# Patient Record
Sex: Male | Born: 1965 | Race: White | Hispanic: No | Marital: Married | Smoking: Never smoker
Health system: Southern US, Community
[De-identification: ages and names within clinical notes are randomized; demographics above are authoritative.]

## PROBLEM LIST (undated history)

## (undated) ENCOUNTER — Ambulatory Visit: Admission: EM | Payer: Worker's Compensation | Source: Home / Self Care

## (undated) DIAGNOSIS — E669 Obesity, unspecified: Secondary | ICD-10-CM

## (undated) DIAGNOSIS — Z9119 Patient's noncompliance with other medical treatment and regimen: Secondary | ICD-10-CM

## (undated) DIAGNOSIS — Z91199 Patient's noncompliance with other medical treatment and regimen due to unspecified reason: Secondary | ICD-10-CM

## (undated) DIAGNOSIS — I4891 Unspecified atrial fibrillation: Secondary | ICD-10-CM

## (undated) DIAGNOSIS — E119 Type 2 diabetes mellitus without complications: Secondary | ICD-10-CM

## (undated) DIAGNOSIS — Z8616 Personal history of COVID-19: Secondary | ICD-10-CM

## (undated) DIAGNOSIS — E785 Hyperlipidemia, unspecified: Secondary | ICD-10-CM

## (undated) DIAGNOSIS — I10 Essential (primary) hypertension: Secondary | ICD-10-CM

## (undated) DIAGNOSIS — K219 Gastro-esophageal reflux disease without esophagitis: Secondary | ICD-10-CM

## (undated) HISTORY — DX: Obesity, unspecified: E66.9

## (undated) HISTORY — DX: Type 2 diabetes mellitus without complications: E11.9

## (undated) HISTORY — DX: Essential (primary) hypertension: I10

## (undated) HISTORY — DX: Patient's noncompliance with other medical treatment and regimen: Z91.19

## (undated) HISTORY — DX: Hyperlipidemia, unspecified: E78.5

## (undated) HISTORY — DX: Gastro-esophageal reflux disease without esophagitis: K21.9

## (undated) HISTORY — PX: FINGER SURGERY: SHX640

## (undated) HISTORY — DX: Patient's noncompliance with other medical treatment and regimen due to unspecified reason: Z91.199

## (undated) HISTORY — DX: Personal history of COVID-19: Z86.16

## (undated) HISTORY — DX: Unspecified atrial fibrillation: I48.91

---

## 2002-04-22 HISTORY — PX: COLONOSCOPY: SHX174

## 2005-11-21 ENCOUNTER — Ambulatory Visit: Payer: Self-pay | Admitting: Family Medicine

## 2006-10-30 ENCOUNTER — Ambulatory Visit: Payer: Self-pay | Admitting: Family Medicine

## 2007-03-25 ENCOUNTER — Ambulatory Visit: Payer: Self-pay | Admitting: Family Medicine

## 2007-09-09 ENCOUNTER — Ambulatory Visit: Payer: Self-pay | Admitting: Family Medicine

## 2007-09-09 ENCOUNTER — Encounter: Admission: RE | Admit: 2007-09-09 | Discharge: 2007-09-09 | Payer: Self-pay | Admitting: Family Medicine

## 2007-09-15 ENCOUNTER — Encounter: Admission: RE | Admit: 2007-09-15 | Discharge: 2007-09-15 | Payer: Self-pay | Admitting: Family Medicine

## 2007-09-16 ENCOUNTER — Ambulatory Visit: Payer: Self-pay | Admitting: Family Medicine

## 2007-10-28 ENCOUNTER — Ambulatory Visit: Payer: Self-pay | Admitting: Family Medicine

## 2007-11-09 ENCOUNTER — Encounter: Admission: RE | Admit: 2007-11-09 | Discharge: 2007-11-09 | Payer: Self-pay | Admitting: Family Medicine

## 2007-12-10 ENCOUNTER — Ambulatory Visit: Payer: Self-pay | Admitting: Family Medicine

## 2008-01-26 ENCOUNTER — Ambulatory Visit: Payer: Self-pay | Admitting: Family Medicine

## 2008-02-03 ENCOUNTER — Ambulatory Visit: Payer: Self-pay | Admitting: Family Medicine

## 2008-02-24 ENCOUNTER — Encounter: Admission: RE | Admit: 2008-02-24 | Discharge: 2008-02-24 | Payer: Self-pay | Admitting: Family Medicine

## 2008-05-06 ENCOUNTER — Ambulatory Visit: Payer: Self-pay | Admitting: Family Medicine

## 2008-05-06 ENCOUNTER — Inpatient Hospital Stay (HOSPITAL_COMMUNITY): Admission: EM | Admit: 2008-05-06 | Discharge: 2008-05-06 | Payer: Self-pay | Admitting: Emergency Medicine

## 2008-05-11 ENCOUNTER — Ambulatory Visit: Payer: Self-pay | Admitting: Family Medicine

## 2008-06-13 ENCOUNTER — Ambulatory Visit: Payer: Self-pay | Admitting: Family Medicine

## 2008-06-22 ENCOUNTER — Ambulatory Visit: Payer: Self-pay | Admitting: Family Medicine

## 2008-09-12 ENCOUNTER — Ambulatory Visit: Payer: Self-pay | Admitting: Family Medicine

## 2008-11-21 ENCOUNTER — Ambulatory Visit: Payer: Self-pay | Admitting: Family Medicine

## 2008-12-07 ENCOUNTER — Ambulatory Visit: Payer: Self-pay | Admitting: Family Medicine

## 2009-01-03 ENCOUNTER — Ambulatory Visit: Payer: Self-pay | Admitting: Family Medicine

## 2009-09-11 ENCOUNTER — Ambulatory Visit: Payer: Self-pay | Admitting: Family Medicine

## 2009-10-10 LAB — HM COLONOSCOPY

## 2010-04-10 ENCOUNTER — Ambulatory Visit: Payer: Self-pay | Admitting: Family Medicine

## 2010-08-06 LAB — TSH: TSH: 3.704 u[IU]/mL (ref 0.350–4.500)

## 2010-08-06 LAB — COMPREHENSIVE METABOLIC PANEL
ALT: 51 U/L (ref 0–53)
AST: 26 U/L (ref 0–37)
Albumin: 4.1 g/dL (ref 3.5–5.2)
Alkaline Phosphatase: 82 U/L (ref 39–117)
Calcium: 9.4 mg/dL (ref 8.4–10.5)
GFR calc Af Amer: >60 mL/min (ref >60)
Glucose, Bld: 157 mg/dL — ABNORMAL HIGH (ref 70–99)
Potassium: 3.6 mEq/L (ref 3.5–5.1)
Sodium: 139 mEq/L (ref 135–145)
Total Protein: 6.5 g/dL (ref 6.0–8.3)

## 2010-08-06 LAB — DIFFERENTIAL
Basophils Relative: 1 % (ref 0–1)
Eosinophils Absolute: 0.2 10*3/uL (ref 0.0–0.7)
Eosinophils Relative: 5 % (ref 0–5)
Lymphs Abs: 1.7 10*3/uL (ref 0.7–4.0)
Monocytes Absolute: 0.4 10*3/uL (ref 0.1–1.0)
Monocytes Relative: 8 % (ref 3–12)
Neutrophils Relative %: 55 % (ref 43–77)

## 2010-08-06 LAB — CK TOTAL AND CKMB (NOT AT ARMC): CK, MB: 2.2 ng/mL (ref 0.3–4.0)

## 2010-08-06 LAB — CBC
Hemoglobin: 14.3 g/dL (ref 13.0–17.0)
MCHC: 34.7 g/dL (ref 30.0–36.0)
RBC: 4.8 MIL/uL (ref 4.22–5.81)
WBC: 5.3 10*3/uL (ref 4.0–10.5)

## 2010-09-04 NOTE — Cardiovascular Report (Signed)
NAMEDONALDSON, RICHTER                     ACCOUNT NO.:  [id number]   MEDICAL RECORD NO.:  [id number]          PATIENT TYPE:  INP   LOCATION:  2852                         FACILITY:  [facility]   PHYSICIAN:  Madaline Savage, M.D.DATE OF BIRTH:  1965/08/22   DATE OF PROCEDURE:  05/06/2008  DATE OF DISCHARGE:  05/06/2008                            CARDIAC CATHETERIZATION   PROCEDURES PERFORMED:  1. Selective coronary angiography by Judkins technique.  2. Retrograde left heart catheterization.  3. Left ventricular angiography.  4. Abdominal aortography with infrarenal aortography and nonselective      renal angiography.   COMPLICATIONS:  None.   ENTRY SITE:  Right femoral.   DYE USED:  Omnipaque.   MEDICATIONS GIVEN:  Intravenous labetalol 20 mg intravenously on 2  occasions.   PATIENT PROFILE:  The patient is a 45 year old obese white gentleman who  is a medical patient of Dr. Ronnald Nian.  He has a history of  hyperlipidemia, hypertension that is poor to control with poor  compliance with medicines and followup care, obesity, family history of  heart disease, and questionable hyperlipidemia.  He presented to the  [facility] Emergency Room today complaining of chest pain that was not  associated with any ischemic changes on his electrocardiogram.  After  discussing the case with Dr. Jacinto Halim who has followed him as an  outpatient, it was felt that it would be a good plan to try to get an  outpatient catheterization while he is here and if the catheterization  was negative, then he could go home tonight as an outpatient and be  managed as an outpatient thereafter that was accomplished today.   PROCEDURE RESULTS:  The procedure was performed by the right  percutaneous femoral artery approach using Omnipaque contrast.  The  patient was placed on the cath lab table supine, was prepped and draped  in the usual fashion, and was given labetalol for elevated blood  pressure.  He did not require  any sedative agents.  His cardiac  catheterization was then performed via the right percutaneous femoral  artery approach using 5-French arterial catheters with the left and  right coronary artery configurations and we performed left ventricular  angiography and infrarenal angiography by the right percutaneous femoral  artery approach.  There were no complications and he tolerated it well.  Results showed the following:  The left ventricular pressure was 155/13,  end-diastolic pressure 23.  The central aortic pressure was 153/98 with  a mean of 124.  There was no significant aortic valve gradient by  pullback technique.   ANGIOGRAPHIC RESULTS:  The patient's coronary arteries were  angiographically patent.  Anatomically, the left main coronary artery  was fairly short.  The LAD coursed to the cardiac apex and bifurcated  there, 3 diagonal branches arose distal to the first septal perforator  branch, all vessels in the left anterior descending arterial tree were  normal.   The left circumflex coronary artery was a nondominant vessel, which gave  rise to a small distal circumflex and a large bifurcating obtuse  marginal  branch, which arose proximally in the circumflex vessel.   The right coronary artery was large and dominant giving rise to a  trifurcating distal right coronary artery with multiple marginal  branches, a pulmonary conus branch and a sinus node branch.  No lesions  were seen.  No evidence of spasm was noted.  Left ventricular  angiography showed vigorous contractility of all wall segments.  Ejection fraction estimate of about 60-70% with no wall motion  abnormalities.  There was no mitral regurgitation seen.   Abdominal aortography was performed with the pigtail catheter adjacent  to the left and right renal arteries.  The renal arteries were both  normal.  There was single vessels to both renals.  The infrarenal  abdominal aorta was normal in diameter and contained no  calcification  and no ectasia.   FINAL IMPRESSIONS:  1. Angiographically patent coronary arteries.  2. Normal left ventricular contractility with ejection fraction of 60-      70%.  3. Normal infrarenal abdominal aorta and normal renal arteries.  4. Hypertension treated with labetalol in the cath lab, improved.   PLAN:  The patient will recover in the cardiac cath lab holding area or  in the Short-Stay Unit and be a candidate for discharge in approximately  3 hours since 5-French arterial catheters were used.  He will be  followed up in our office either by Dr. Jacinto Halim or Dr. Elsie Lincoln or one of  our partners.  He will return to the excellent care of Dr. Sharlot Gowda.           ______________________________  Madaline Savage, M.D.     WHG/MEDQ  D:  05/06/2008  T:  05/07/2008  Job:  [id number]   cc:   Sharlot Gowda, M.D.

## 2010-09-04 NOTE — H&P (Signed)
Marcus Holland, Marcus Holland                     ACCOUNT NO.:  [id number]   MEDICAL RECORD NO.:  [id number]          PATIENT TYPE:  INP   LOCATION:  2852                         FACILITY:  [facility]   PHYSICIAN:  Madaline Savage, M.D.DATE OF BIRTH:  06-15-1965   DATE OF ADMISSION:  05/06/2008  DATE OF DISCHARGE:  05/06/2008                              HISTORY & PHYSICAL   CHIEF COMPLAINT:  Chest pain and indigestion.   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  He has had some  intermittent chest pain, which has been going on for about 7 days.  He  describes it as indigestion.  He did get some relief with Pepcid AC.  He has multiple risk factors for coronary artery disease including  hypertension, dyslipidemia, and family history.  He has been seen by Dr.  Jacinto Halim before, and we recommended a Myoview study, but this was never  done.  The patient has had some issues with medicine compliance.  He is  seen now in the emergency room after he called Dr. Jola Babinski office  complaining of his indigestion.  Dr. Jola Babinski office felt he had an  abnormal EKG.   PAST MEDICAL HISTORY:  Remarkable for hypertension and dyslipidemia.  He  has no major surgeries, he did have some right thumb surgery in 2008.   CURRENT MEDICATIONS:  Pepcid AC p.r.n.; in the past, he has been on  Benicar/hydrochlorothiazide and Toprol.   He has no known drug allergies.   SOCIAL HISTORY:  He is married.  He is a nonsmoker.  He does not drink  alcohol.  He does not exercise on a regular basis.   FAMILY HISTORY:  Remarkable for coronary artery disease, his grandfather  had an MI at 53, grandmother died at 52.  His mother is alive at age of  47 with a history of breast cancer, and his father died at 63 with lung  cancer.   REVIEW OF SYSTEMS:  Essentially unremarkable except for noted above.  He  denies any GI bleeding or melena.  He has not had  syncope or  tachycardia.   PHYSICAL EXAMINATION:  VITAL SIGNS:  Blood pressure 136/100, pulse 72,  respirations 12.  GENERAL:  He is a well-developed, well-nourished male in no acute  distress.  HEENT:  Normocephalic.  Extraocular movements are intact.  Sclerae  nonicteric.  Lids and conjunctivae within normal limits.  NECK:  Without JVD or bruit.  CHEST:  Clear to auscultation and percussion.  CARDIAC:  Regular rate and rhythm without murmur, rub, or gallop.  Normal S1 and S2.  ABDOMEN:  Obese, nontender.  No hepatosplenomegaly.  EXTREMITIES:  Without edema, without clubbing, without cyanosis.  NEUROLOGIC:  Grossly intact.  He is awake, alert, oriented, cooperative.  Moves all extremities without obvious deficit.   His EKG shows sinus rhythm with nonspecific ST changes, he has a Q wave  in lead III of unclear significance.   IMPRESSION:  1. Chest pain with multiple cardiac risk  factors, consistent with      unstable angina.  2. Hypertension with poor control.  3. Untreated dyslipidemia.  4. Family history of coronary artery disease.  5. Obesity.   PLAN:  The patient was seen by Dr. Elsie Lincoln in the emergency room.  He  will be set up for a diagnostic catheterization.      Marcus Holland, P.A.    ______________________________  Madaline Savage, M.D.    Lenard Lance  D:  05/06/2008  T:  05/07/2008  Job:  [id number]

## 2012-01-10 ENCOUNTER — Encounter: Payer: Self-pay | Admitting: Family Medicine

## 2012-01-10 ENCOUNTER — Ambulatory Visit (INDEPENDENT_AMBULATORY_CARE_PROVIDER_SITE_OTHER): Payer: BC Managed Care – PPO | Admitting: Family Medicine

## 2012-01-10 VITALS — BP 170/110 | HR 60 | Wt 229.0 lb

## 2012-01-10 DIAGNOSIS — E1169 Type 2 diabetes mellitus with other specified complication: Secondary | ICD-10-CM

## 2012-01-10 DIAGNOSIS — E118 Type 2 diabetes mellitus with unspecified complications: Secondary | ICD-10-CM | POA: Insufficient documentation

## 2012-01-10 DIAGNOSIS — I152 Hypertension secondary to endocrine disorders: Secondary | ICD-10-CM | POA: Insufficient documentation

## 2012-01-10 DIAGNOSIS — E119 Type 2 diabetes mellitus without complications: Secondary | ICD-10-CM | POA: Insufficient documentation

## 2012-01-10 DIAGNOSIS — E1159 Type 2 diabetes mellitus with other circulatory complications: Secondary | ICD-10-CM | POA: Insufficient documentation

## 2012-01-10 DIAGNOSIS — I10 Essential (primary) hypertension: Secondary | ICD-10-CM

## 2012-01-10 DIAGNOSIS — L980 Pyogenic granuloma: Secondary | ICD-10-CM

## 2012-01-10 LAB — CBC WITH DIFFERENTIAL/PLATELET
Basophils Absolute: 0 10*3/uL (ref 0.0–0.1)
Eosinophils Relative: 3 % (ref 0–5)
HCT: 43.4 % (ref 39.0–52.0)
Hemoglobin: 14.9 g/dL (ref 13.0–17.0)
Lymphocytes Relative: 38 % (ref 12–46)
Lymphs Abs: 1.7 10*3/uL (ref 0.7–4.0)
MCV: 85.4 fL (ref 78.0–100.0)
Monocytes Absolute: 0.3 10*3/uL (ref 0.1–1.0)
Neutro Abs: 2.4 10*3/uL (ref 1.7–7.7)
RBC: 5.08 MIL/uL (ref 4.22–5.81)
RDW: 13.1 % (ref 11.5–15.5)
WBC: 4.6 10*3/uL (ref 4.0–10.5)

## 2012-01-10 LAB — LIPID PANEL
Cholesterol: 233 mg/dL — ABNORMAL HIGH (ref 0–200)
HDL: 36 mg/dL — ABNORMAL LOW (ref >39)
Total CHOL/HDL Ratio: 6.5 Ratio

## 2012-01-10 LAB — COMPREHENSIVE METABOLIC PANEL
ALT: 27 U/L (ref 0–53)
AST: 18 U/L (ref 0–37)
Albumin: 4.6 g/dL (ref 3.5–5.2)
BUN: 13 mg/dL (ref 6–23)
CO2: 29 mEq/L (ref 19–32)
Calcium: 9.5 mg/dL (ref 8.4–10.5)
Chloride: 101 mEq/L (ref 96–112)
Creat: 1.02 mg/dL (ref 0.50–1.35)
Potassium: 3.6 mEq/L (ref 3.5–5.3)

## 2012-01-10 MED ORDER — LISINOPRIL-HYDROCHLOROTHIAZIDE 20-12.5 MG PO TABS: 1.0000 | ORAL_TABLET | Freq: Every day | ORAL | Status: DC

## 2012-01-10 NOTE — Progress Notes (Signed)
  Subjective:    Patient ID: Marcus Holland, male    DOB: Jul 04, 1965, 46 y.o.   MRN: [id number]  HPI He is here after a two-year break. He has been getting his financial affairs in order. Presently he is on no medications. He has been periodically checking his blood sugars he states have been within good range. He is in the process of making dietary changes to help with weight reduction and apparently has lost approximately 20 pounds.. He does plan to come back in the near future for complete examination.h Hs main reason for being here today is he has a lesion on the distal left index finger.   Review of Systems     Objective:   Physical Exam Alert and in no distress. Blood pressure is recorded. Round reddish raised lesion noted on the distal index finger Hb A1c is 6.7     Assessment & Plan:   1. Pyogenic granuloma of skin  Ambulatory referral to Dermatology  2. Type II or unspecified type diabetes mellitus without mention of complication, not stated as uncontrolled  HgB A1c, CBC with Differential, Comprehensive metabolic panel, Lipid panel, POCT UA - Microalbumin  3. Hypertension associated with diabetes  CBC with Differential, Comprehensive metabolic panel, Lipid panel, lisinopril-hydrochlorothiazide (ZESTORETIC) 20-12.5 MG per tablet  had a long discussion with him concerning proper care for his diabetes.he apparently has done fairly good job especially since he has lost weight. I will place him back on lisinopril. He will continue to check his blood sugars. History turned here for complete examination. Explained that I think the granuloma is benign and the dermatologist will probably removed without difficulty He is to set up an appointment for a complete exam at his convenience.

## 2012-01-10 NOTE — Progress Notes (Signed)
Pt is scheduled with [facility] Friday October 11,2013 @11 :30 with Satira Mccallum PA.

## 2012-01-15 ENCOUNTER — Telehealth: Payer: Self-pay | Admitting: Family Medicine

## 2012-01-15 NOTE — Telephone Encounter (Signed)
Please call Dr. Dorita Sciara office and see if appt can be moved up (scheduled for mid October)

## 2012-01-16 ENCOUNTER — Telehealth: Payer: Self-pay | Admitting: *Deleted

## 2012-01-16 NOTE — Telephone Encounter (Signed)
Dr.Lupton had a cx for this morning at 10:20 am, spoke with patient and he will take this appt.

## 2012-01-27 ENCOUNTER — Encounter: Payer: Self-pay | Admitting: Family Medicine

## 2012-02-05 ENCOUNTER — Encounter: Payer: Self-pay | Admitting: Internal Medicine

## 2012-02-17 ENCOUNTER — Encounter: Payer: BC Managed Care – PPO | Admitting: Family Medicine

## 2013-05-03 ENCOUNTER — Ambulatory Visit (INDEPENDENT_AMBULATORY_CARE_PROVIDER_SITE_OTHER): Payer: BC Managed Care – PPO | Admitting: Family Medicine

## 2013-05-03 ENCOUNTER — Encounter: Payer: Self-pay | Admitting: Family Medicine

## 2013-05-03 VITALS — BP 152/112 | HR 98 | Wt 229.0 lb

## 2013-05-03 DIAGNOSIS — E1169 Type 2 diabetes mellitus with other specified complication: Secondary | ICD-10-CM

## 2013-05-03 DIAGNOSIS — Z9119 Patient's noncompliance with other medical treatment and regimen: Secondary | ICD-10-CM

## 2013-05-03 DIAGNOSIS — K219 Gastro-esophageal reflux disease without esophagitis: Secondary | ICD-10-CM

## 2013-05-03 DIAGNOSIS — E119 Type 2 diabetes mellitus without complications: Secondary | ICD-10-CM

## 2013-05-03 DIAGNOSIS — I10 Essential (primary) hypertension: Secondary | ICD-10-CM

## 2013-05-03 DIAGNOSIS — E785 Hyperlipidemia, unspecified: Secondary | ICD-10-CM

## 2013-05-03 DIAGNOSIS — E1159 Type 2 diabetes mellitus with other circulatory complications: Secondary | ICD-10-CM

## 2013-05-03 DIAGNOSIS — I152 Hypertension secondary to endocrine disorders: Secondary | ICD-10-CM

## 2013-05-03 DIAGNOSIS — R1012 Left upper quadrant pain: Secondary | ICD-10-CM

## 2013-05-03 DIAGNOSIS — Z91199 Patient's noncompliance with other medical treatment and regimen due to unspecified reason: Secondary | ICD-10-CM

## 2013-05-03 LAB — COMPREHENSIVE METABOLIC PANEL
ALK PHOS: 92 U/L (ref 39–117)
ALT: 29 U/L (ref 0–53)
AST: 13 U/L (ref 0–37)
Albumin: 4.4 g/dL (ref 3.5–5.2)
BILIRUBIN TOTAL: 0.6 mg/dL (ref 0.3–1.2)
BUN: 14 mg/dL (ref 6–23)
CO2: 30 mEq/L (ref 19–32)
Calcium: 9.4 mg/dL (ref 8.4–10.5)
Chloride: 99 mEq/L (ref 96–112)
Creat: 0.83 mg/dL (ref 0.50–1.35)
Glucose, Bld: 240 mg/dL — ABNORMAL HIGH (ref 70–99)
Potassium: 3.8 mEq/L (ref 3.5–5.3)
SODIUM: 139 meq/L (ref 135–145)
TOTAL PROTEIN: 6.8 g/dL (ref 6.0–8.3)

## 2013-05-03 LAB — CBC WITH DIFFERENTIAL/PLATELET
BASOS ABS: 0 10*3/uL (ref 0.0–0.1)
BASOS PCT: 0 % (ref 0–1)
EOS ABS: 0.2 10*3/uL (ref 0.0–0.7)
Eosinophils Relative: 4 % (ref 0–5)
HCT: 43.5 % (ref 39.0–52.0)
Hemoglobin: 15.1 g/dL (ref 13.0–17.0)
Lymphocytes Relative: 30 % (ref 12–46)
Lymphs Abs: 1.6 10*3/uL (ref 0.7–4.0)
MCH: 29.6 pg (ref 26.0–34.0)
MCHC: 34.7 g/dL (ref 30.0–36.0)
MCV: 85.3 fL (ref 78.0–100.0)
Monocytes Absolute: 0.3 10*3/uL (ref 0.1–1.0)
Monocytes Relative: 6 % (ref 3–12)
NEUTROS PCT: 60 % (ref 43–77)
Neutro Abs: 3.2 10*3/uL (ref 1.7–7.7)
PLATELETS: 259 10*3/uL (ref 150–400)
RBC: 5.1 MIL/uL (ref 4.22–5.81)
RDW: 13.5 % (ref 11.5–15.5)
WBC: 5.3 10*3/uL (ref 4.0–10.5)

## 2013-05-03 LAB — LIPID PANEL
CHOL/HDL RATIO: 6.5 ratio
Cholesterol: 247 mg/dL — ABNORMAL HIGH (ref 0–200)
HDL: 38 mg/dL — ABNORMAL LOW (ref >39)
LDL CALC: 178 mg/dL — AB (ref 0–99)
Triglycerides: 156 mg/dL — ABNORMAL HIGH (ref <150)
VLDL: 31 mg/dL (ref 0–40)

## 2013-05-03 NOTE — Progress Notes (Signed)
   Subjective:    Patient ID: Marcus Holland, male    DOB: May 24, 1965, 48 y.o.   MRN: [id number]  HPI He is here for evaluation of left upper quadrant pain that started approximately 10 days ago. He notes the pain increases with physical activity. No cough, congestion, nausea, vomiting, urinary symptoms or bowel habits are unchanged. He has had some difficulty with reflux symptoms especially when he lies down at night. He has tried OTC medications with good results. The symptoms do not occur on a daily basis. He continues on the blood pressure medication. He is not been very compliant with followup on his overall care and diabetes as has been quite sometime since he was last here. He is scheduled for a followup appointment in one month.   Review of Systems     Objective:   Physical Exam alert and in no distress. Tympanic membranes and canals are normal. Throat is clear. Tonsils are normal. Neck is supple without adenopathy or thyromegaly. Cardiac exam shows a regular sinus rhythm without murmurs or gallops. Lungs are clear to auscultation. Abdominal exam shows slight tenderness palpation in the left upper quadrant. When he raised his head, the pain was reproduced.        Assessment & Plan:  Type II or unspecified type diabetes mellitus without mention of complication, not stated as uncontrolled - Plan: CBC with Differential, Comprehensive metabolic panel, Lipid panel, Hemoglobin A1c  Hypertension associated with diabetes - Plan: CBC with Differential, Comprehensive metabolic panel  Hyperlipidemia LDL goal <70 - Plan: Lipid panel  Abdominal pain, left upper quadrant  Personal history of noncompliance with medical treatment, presenting hazards to health  GERD (gastroesophageal reflux disease)  I will do routine blood screening on him. The pain seems to be mostly musculoskeletal in nature although there is not a good reason for him to have this. He will keep track of his discomfort and call or  I will see him again with his complete examination.

## 2013-05-03 NOTE — Patient Instructions (Signed)
For your indigestion symptoms, try Pepcid, Zantac or Axid her you can take double the dosing and if these don't work then try Prilosec. That doesn't work call me

## 2013-05-05 LAB — HEMOGLOBIN A1C
Hgb A1c MFr Bld: 10.5 % — ABNORMAL HIGH (ref <5.7)
MEAN PLASMA GLUCOSE: 255 mg/dL — AB (ref <117)

## 2013-05-06 MED ORDER — ATORVASTATIN CALCIUM 20 MG PO TABS: 20.0000 mg | ORAL_TABLET | Freq: Every day | ORAL | Status: DC

## 2013-05-06 MED ORDER — METFORMIN HCL 500 MG PO TABS: 500.0000 mg | ORAL_TABLET | Freq: Two times a day (BID) | ORAL | Status: DC

## 2013-05-06 NOTE — Addendum Note (Signed)
Addended by: Ronnald NianLALONDE, JOHN C on: 05/06/2013 08:18 AM   Modules accepted: Orders

## 2013-05-06 NOTE — Addendum Note (Signed)
Addended by: Jeri CosELLEBY, Delaney Perona S on: 05/06/2013 04:32 PM   Modules accepted: Orders

## 2013-05-28 ENCOUNTER — Encounter: Payer: Self-pay | Admitting: Family Medicine

## 2013-06-07 ENCOUNTER — Encounter: Payer: Self-pay | Admitting: Family Medicine

## 2013-06-07 ENCOUNTER — Ambulatory Visit (INDEPENDENT_AMBULATORY_CARE_PROVIDER_SITE_OTHER): Payer: BC Managed Care – PPO | Admitting: Family Medicine

## 2013-06-07 ENCOUNTER — Telehealth: Payer: Self-pay | Admitting: Family Medicine

## 2013-06-07 VITALS — BP 170/110 | HR 68 | Ht 73.0 in | Wt 232.0 lb

## 2013-06-07 DIAGNOSIS — R195 Other fecal abnormalities: Secondary | ICD-10-CM

## 2013-06-07 DIAGNOSIS — I152 Hypertension secondary to endocrine disorders: Secondary | ICD-10-CM

## 2013-06-07 DIAGNOSIS — E785 Hyperlipidemia, unspecified: Secondary | ICD-10-CM

## 2013-06-07 DIAGNOSIS — I10 Essential (primary) hypertension: Secondary | ICD-10-CM

## 2013-06-07 DIAGNOSIS — E1159 Type 2 diabetes mellitus with other circulatory complications: Secondary | ICD-10-CM

## 2013-06-07 DIAGNOSIS — E1169 Type 2 diabetes mellitus with other specified complication: Secondary | ICD-10-CM

## 2013-06-07 DIAGNOSIS — K219 Gastro-esophageal reflux disease without esophagitis: Secondary | ICD-10-CM

## 2013-06-07 DIAGNOSIS — E119 Type 2 diabetes mellitus without complications: Secondary | ICD-10-CM

## 2013-06-07 DIAGNOSIS — Z0000 Encounter for general adult medical examination without abnormal findings: Secondary | ICD-10-CM

## 2013-06-07 DIAGNOSIS — E669 Obesity, unspecified: Secondary | ICD-10-CM

## 2013-06-07 LAB — POCT URINALYSIS DIPSTICK
Bilirubin, UA: NEGATIVE
Glucose, UA: 250
KETONES UA: NEGATIVE
LEUKOCYTES UA: NEGATIVE
NITRITE UA: NEGATIVE
PH UA: 5
PROTEIN UA: NEGATIVE
RBC UA: NEGATIVE
Spec Grav, UA: 1.015
Urobilinogen, UA: NEGATIVE

## 2013-06-07 LAB — HEMOCCULT GUIAC POC 1CARD (OFFICE)

## 2013-06-07 MED ORDER — AMLODIPINE BESYLATE 10 MG PO TABS: 10.0000 mg | ORAL_TABLET | Freq: Every day | ORAL | Status: DC

## 2013-06-07 MED ORDER — ATORVASTATIN CALCIUM 20 MG PO TABS: 20.0000 mg | ORAL_TABLET | Freq: Every day | ORAL | Status: DC

## 2013-06-07 MED ORDER — METFORMIN HCL 500 MG PO TABS: 500.0000 mg | ORAL_TABLET | Freq: Two times a day (BID) | ORAL | Status: DC

## 2013-06-07 MED ORDER — LISINOPRIL-HYDROCHLOROTHIAZIDE 20-12.5 MG PO TABS: 1.0000 | ORAL_TABLET | Freq: Every day | ORAL | Status: DC

## 2013-06-07 NOTE — Telephone Encounter (Signed)
done

## 2013-06-07 NOTE — Telephone Encounter (Signed)
rcd [facility] from [facility] for refill for Metformin 500 mg  90 day tab with 4 refills and Atorvastatin tabs 20 mg  90 day supply with 4 refills.

## 2013-06-07 NOTE — Progress Notes (Signed)
Subjective:    Patient ID: Marcus Holland, male    DOB: 07-09-1965, 48 y.o.   MRN: [id number]  HPI He is here for complete examination. He recently started taking his medications again and states his blood sugar is in the 150-200 range which is an improvement from prior readings. He did run out of his blood pressure medication about 3 days ago. The record was reviewed and does show consistent elevations recently. His eating habits and exercise pattern is essentially unchanged. Smoking and drinking were reviewed. His marriage is going well although his wife is having some medical issues and is applying for disability. His reflux symptoms seem to be under good control. Social and family history were otherwise reviewed.   Review of Systems  All other systems reviewed and are negative.       Objective:   Physical Exam BP 170/110  Pulse 68  Ht 6\' 1"  (1.854 m)  Wt 232 lb (105.235 kg)  BMI 30.62 kg/m2  General Appearance:    Alert, cooperative, no distress, appears stated age  Head:    Normocephalic, without obvious abnormality, atraumatic  Eyes:    PERRL, conjunctiva/corneas clear, EOM's intact.  Ears:    Normal TM's and external ear canals  Nose:   Nares normal, mucosa normal, no drainage or sinus   tenderness  Throat:   Lips, mucosa, and tongue normal; teeth and gums normal  Neck:   Supple, no lymphadenopathy;  thyroid:  no   enlargement/tenderness/nodules; no carotid   bruit or JVD  Back:    Spine nontender, no curvature, ROM normal, no CVA     tenderness  Lungs:     Clear to auscultation bilaterally without wheezes, rales or     ronchi; respirations unlabored  Chest Wall:    No tenderness or deformity   Heart:    Regular rate and rhythm, S1 and S2 normal, no murmur, rub   or gallop  Breast Exam:    No chest wall tenderness, masses or gynecomastia  Abdomen:     Soft, non-tender, nondistended, normoactive bowel sounds,    no masses, no hepatosplenomegaly  Genitalia:    Normal male  external genitalia without lesions.  Testicles without masses.  No inguinal hernias.  Rectal:    Normal sphincter tone, no masses or tenderness; guaiac positive Prostate smooth, no nodules, not enlarged.  Extremities:   No clubbing, cyanosis or edema  Pulses:   2+ and symmetric all extremities  Skin:   Skin color, texture, turgor normal, no rashes or lesions  Lymph nodes:   Cervical, supraclavicular, and axillary nodes normal  Neurologic:   CNII-XII intact, normal strength, sensation and gait; reflexes 2+ and symmetric throughout          Psych:   Normal mood, affect, hygiene and grooming.          Assessment & Plan:  Routine general medical examination at a health care facility - Plan: Urinalysis Dipstick, POCT occult blood stool  GERD (gastroesophageal reflux disease)  Hyperlipidemia LDL goal <70  Hypertension associated with diabetes - Plan: amLODipine (NORVASC) 10 MG tablet, lisinopril-hydrochlorothiazide (ZESTORETIC) 20-12.5 MG per tablet  Type II or unspecified type diabetes mellitus without mention of complication, not stated as uncontrolled  Stool guaiac positive - Plan: Ambulatory referral to Gastroenterology  Obesity (BMI 30-39.9)  I will add Norvasc to his regimen since he has been on this in the past. Recommend he return here in roughly one month for recheck on that and also  to check his lipids. He will also be referred for further evaluation of his guaiac-positive stool. stool.  Also discussed making further diet and exercise changes to help with weight reduction.

## 2013-07-05 ENCOUNTER — Ambulatory Visit (INDEPENDENT_AMBULATORY_CARE_PROVIDER_SITE_OTHER): Payer: BC Managed Care – PPO | Admitting: Family Medicine

## 2013-07-05 ENCOUNTER — Encounter: Payer: Self-pay | Admitting: Family Medicine

## 2013-07-05 VITALS — BP 140/98 | HR 64 | Wt 229.0 lb

## 2013-07-05 DIAGNOSIS — E1159 Type 2 diabetes mellitus with other circulatory complications: Secondary | ICD-10-CM

## 2013-07-05 DIAGNOSIS — E1169 Type 2 diabetes mellitus with other specified complication: Secondary | ICD-10-CM

## 2013-07-05 DIAGNOSIS — I152 Hypertension secondary to endocrine disorders: Secondary | ICD-10-CM

## 2013-07-05 DIAGNOSIS — I10 Essential (primary) hypertension: Secondary | ICD-10-CM

## 2013-07-05 MED ORDER — TERAZOSIN HCL 1 MG PO CAPS: 1.0000 mg | ORAL_CAPSULE | Freq: Every day | ORAL | Status: DC

## 2013-07-05 NOTE — Progress Notes (Signed)
   Subjective:    Patient ID: Marcus Holland, male    DOB: 1965-05-08, 48 y.o.   MRN: [id number]  HPI He is here for a recheck. Since his last visit his Norvasc was increased to 10 mg. He has no concerns or complaints concerning this.   Review of Systems     Objective:   Physical Exam Alert and in no distress. Blood pressure is recorded.       Assessment & Plan:  Hypertension associated with diabetes - Plan: terazosin (HYTRIN) 1 MG capsule  I have decided to add Hytrin to his regimen. Discussed possible side effects and the first dose effect. I will also refer him to [employer] for possible inclusion in one of the studies Recheck here in one month.

## 2013-08-11 ENCOUNTER — Encounter: Payer: Self-pay | Admitting: Family Medicine

## 2013-08-11 ENCOUNTER — Ambulatory Visit (INDEPENDENT_AMBULATORY_CARE_PROVIDER_SITE_OTHER): Payer: BC Managed Care – PPO | Admitting: Family Medicine

## 2013-08-11 VITALS — BP 120/90 | HR 56 | Wt 232.0 lb

## 2013-08-11 DIAGNOSIS — I10 Essential (primary) hypertension: Secondary | ICD-10-CM

## 2013-08-11 DIAGNOSIS — I152 Hypertension secondary to endocrine disorders: Secondary | ICD-10-CM

## 2013-08-11 DIAGNOSIS — E1159 Type 2 diabetes mellitus with other circulatory complications: Secondary | ICD-10-CM

## 2013-08-11 DIAGNOSIS — E119 Type 2 diabetes mellitus without complications: Secondary | ICD-10-CM

## 2013-08-11 DIAGNOSIS — E1169 Type 2 diabetes mellitus with other specified complication: Secondary | ICD-10-CM

## 2013-08-11 MED ORDER — METFORMIN HCL ER 750 MG PO TB24: ORAL_TABLET | ORAL | Status: DC

## 2013-08-11 NOTE — Progress Notes (Signed)
   Subjective:    Patient ID: Marcus Holland, male    DOB: [birth year], 48 y.o.   MRN: [id number]  HPI He is here for a recheck. He is on medications listed in the chart. His had no difficulty with the new medication. He did see if he qualified for one a research days and apparently he did not. Presently he is on 500 twice a day of metformin.   Review of Systems     Objective:   Physical Exam Alert and in no distress otherwise not examined       Assessment & Plan:  Hypertension associated with diabetes  Type II or unspecified type diabetes mellitus without mention of complication, not stated as uncontrolled - Plan: metFORMIN (GLUCOPHAGE-XR) 750 MG 24 hr tablet  I will increase his metformin and recheck this in about 3 months.

## 2013-11-08 ENCOUNTER — Ambulatory Visit: Payer: Self-pay | Admitting: Family Medicine

## 2013-11-16 ENCOUNTER — Other Ambulatory Visit: Payer: Self-pay

## 2013-11-16 ENCOUNTER — Telehealth: Payer: Self-pay | Admitting: Internal Medicine

## 2013-11-16 DIAGNOSIS — I152 Hypertension secondary to endocrine disorders: Secondary | ICD-10-CM

## 2013-11-16 DIAGNOSIS — I10 Essential (primary) hypertension: Principal | ICD-10-CM

## 2013-11-16 DIAGNOSIS — E1159 Type 2 diabetes mellitus with other circulatory complications: Secondary | ICD-10-CM

## 2013-11-16 DIAGNOSIS — E119 Type 2 diabetes mellitus without complications: Secondary | ICD-10-CM

## 2013-11-16 MED ORDER — TERAZOSIN HCL 1 MG PO CAPS: 1.0000 mg | ORAL_CAPSULE | Freq: Every day | ORAL | Status: DC

## 2013-11-16 MED ORDER — METFORMIN HCL ER 750 MG PO TB24: ORAL_TABLET | ORAL | Status: DC

## 2013-11-16 NOTE — Telephone Encounter (Signed)
Refill request for a 90 day supply for mertformin 750mg  and terazosin 1mg  [facility]

## 2013-11-16 NOTE — Telephone Encounter (Signed)
SENT IN MED PER PT REQUEST

## 2013-11-16 NOTE — Telephone Encounter (Signed)
DONE

## 2013-11-17 ENCOUNTER — Ambulatory Visit (INDEPENDENT_AMBULATORY_CARE_PROVIDER_SITE_OTHER): Payer: BC Managed Care – PPO | Admitting: Family Medicine

## 2013-11-17 ENCOUNTER — Encounter: Payer: Self-pay | Admitting: Family Medicine

## 2013-11-17 VITALS — BP 122/82 | HR 73 | Wt 230.0 lb

## 2013-11-17 DIAGNOSIS — I10 Essential (primary) hypertension: Secondary | ICD-10-CM

## 2013-11-17 DIAGNOSIS — E1169 Type 2 diabetes mellitus with other specified complication: Secondary | ICD-10-CM

## 2013-11-17 DIAGNOSIS — E119 Type 2 diabetes mellitus without complications: Secondary | ICD-10-CM

## 2013-11-17 DIAGNOSIS — I152 Hypertension secondary to endocrine disorders: Secondary | ICD-10-CM

## 2013-11-17 DIAGNOSIS — E1159 Type 2 diabetes mellitus with other circulatory complications: Secondary | ICD-10-CM

## 2013-11-17 DIAGNOSIS — E785 Hyperlipidemia, unspecified: Secondary | ICD-10-CM

## 2013-11-17 LAB — LIPID PANEL
Cholesterol: 123 mg/dL (ref 0–200)
HDL: 37 mg/dL — ABNORMAL LOW (ref >39)
LDL CALC: 69 mg/dL (ref 0–99)
Total CHOL/HDL Ratio: 3.3 Ratio
Triglycerides: 85 mg/dL (ref <150)
VLDL: 17 mg/dL (ref 0–40)

## 2013-11-17 LAB — POCT GLYCOSYLATED HEMOGLOBIN (HGB A1C): Hemoglobin A1C: 7.2

## 2013-11-17 NOTE — Progress Notes (Signed)
  Subjective:    Marcus Holland Vital is a 48 y.o. male who presents for follow-up of Type 2 diabetes mellitus.  He is now taking his statin regularly as well as his antihypertensive medications and is having no difficulty with them.  Home blood sugar records: 120 to 130. He is checking these fairly regularly.  Current symptoms/problems none Daily foot checks:   Any foot concerns none Last eye exam:  [employer] [facility] in January 2015   Medication compliance: Good. Current diet: none Current exercise: walking a lot (work) Known diabetic complications: none Cardiovascular risk factors: diabetes mellitus, dyslipidemia, hypertension, male gender and obesity (BMI >= 30 kg/m2)    ROS as in subjective above    Objective:   General appearence: alert, no distress, WD/WN   Lab Review Lab Results  Component Value Date   HGBA1C 10.5* 05/03/2013   Lab Results  Component Value Date   CHOL 247* 05/03/2013   HDL 38* 05/03/2013   LDLCALC 178* 05/03/2013   TRIG 156* 05/03/2013   CHOLHDL 6.5 05/03/2013   No results found for this basenameConcepcion Elk: MICROALBUR, [id number]     Chemistry      Component Value Date/Time   NA 139 05/03/2013 0952   K 3.8 05/03/2013 0952   CL 99 05/03/2013 0952   CO2 30 05/03/2013 0952   BUN 14 05/03/2013 0952   CREATININE 0.83 05/03/2013 0952   CREATININE 0.90 05/06/2008 1220      Component Value Date/Time   CALCIUM 9.4 05/03/2013 0952   ALKPHOS 92 05/03/2013 0952   AST 13 05/03/2013 0952   ALT 29 05/03/2013 0952   BILITOT 0.6 05/03/2013 0952        Chemistry      Component Value Date/Time   NA 139 05/03/2013 0952   K 3.8 05/03/2013 0952   CL 99 05/03/2013 0952   CO2 30 05/03/2013 0952   BUN 14 05/03/2013 0952   CREATININE 0.83 05/03/2013 0952   CREATININE 0.90 05/06/2008 1220      Component Value Date/Time   CALCIUM 9.4 05/03/2013 0952   ALKPHOS 92 05/03/2013 0952   AST 13 05/03/2013 0952   ALT 29 05/03/2013 0952   BILITOT 0.6 05/03/2013 0952       Hemoglobin A1c  7.2    Assessment:   Encounter Diagnoses  Name Primary?  . Type II or unspecified type diabetes mellitus without mention of complication, not stated as uncontrolled Yes  . Hypertension associated with diabetes   . Hyperlipidemia LDL goal <70          Plan:    1.  Rx changes: none 2.  Education: Reviewed 'ABCs' of diabetes management (respective goals in parentheses):  A1C (<7), blood pressure (<130/80), and cholesterol (LDL <100). 3.  Compliance at present is estimated to be good. Efforts to improve compliance (if necessary) will be directed at No major changes. 4. Follow up: 4 months  Encouraged him to continue with his present occasion regimen and lifestyle.

## 2013-12-23 ENCOUNTER — Other Ambulatory Visit: Payer: Self-pay | Admitting: Family Medicine

## 2014-02-17 ENCOUNTER — Other Ambulatory Visit: Payer: Self-pay | Admitting: Family Medicine

## 2014-04-22 HISTORY — PX: CHOLECYSTECTOMY: SHX55

## 2014-05-07 ENCOUNTER — Other Ambulatory Visit: Payer: Self-pay | Admitting: Family Medicine

## 2014-06-18 ENCOUNTER — Other Ambulatory Visit: Payer: Self-pay | Admitting: Family Medicine

## 2014-07-28 ENCOUNTER — Other Ambulatory Visit: Payer: Self-pay | Admitting: Family Medicine

## 2014-08-05 ENCOUNTER — Telehealth: Payer: Self-pay

## 2014-08-05 NOTE — Telephone Encounter (Signed)
Patient has appointment for 08/09/14

## 2014-08-09 ENCOUNTER — Ambulatory Visit (INDEPENDENT_AMBULATORY_CARE_PROVIDER_SITE_OTHER): Payer: BLUE CROSS/BLUE SHIELD | Admitting: Family Medicine

## 2014-08-09 ENCOUNTER — Encounter: Payer: Self-pay | Admitting: Family Medicine

## 2014-08-09 VITALS — BP 124/84 | HR 76 | Ht 73.0 in | Wt 232.0 lb

## 2014-08-09 DIAGNOSIS — K219 Gastro-esophageal reflux disease without esophagitis: Secondary | ICD-10-CM

## 2014-08-09 DIAGNOSIS — E119 Type 2 diabetes mellitus without complications: Secondary | ICD-10-CM

## 2014-08-09 DIAGNOSIS — E785 Hyperlipidemia, unspecified: Secondary | ICD-10-CM | POA: Diagnosis not present

## 2014-08-09 DIAGNOSIS — I10 Essential (primary) hypertension: Secondary | ICD-10-CM | POA: Diagnosis not present

## 2014-08-09 DIAGNOSIS — B351 Tinea unguium: Secondary | ICD-10-CM | POA: Diagnosis not present

## 2014-08-09 DIAGNOSIS — E1159 Type 2 diabetes mellitus with other circulatory complications: Secondary | ICD-10-CM

## 2014-08-09 DIAGNOSIS — Z0000 Encounter for general adult medical examination without abnormal findings: Secondary | ICD-10-CM | POA: Diagnosis not present

## 2014-08-09 DIAGNOSIS — E1169 Type 2 diabetes mellitus with other specified complication: Secondary | ICD-10-CM

## 2014-08-09 DIAGNOSIS — I152 Hypertension secondary to endocrine disorders: Secondary | ICD-10-CM

## 2014-08-09 DIAGNOSIS — E669 Obesity, unspecified: Secondary | ICD-10-CM

## 2014-08-09 LAB — POCT UA - MICROALBUMIN
Albumin/Creatinine Ratio, Urine, POC: 5.5
Creatinine, POC: 139.3 mg/dL
Microalbumin Ur, POC: 5.5 mg/L

## 2014-08-09 LAB — CBC WITH DIFFERENTIAL/PLATELET
Basophils Absolute: 0.1 K/uL (ref 0.0–0.1)
Basophils Relative: 1 % (ref 0–1)
Eosinophils Absolute: 0.2 K/uL (ref 0.0–0.7)
Eosinophils Relative: 4 % (ref 0–5)
HCT: 40.5 % (ref 39.0–52.0)
Hemoglobin: 13.7 g/dL (ref 13.0–17.0)
Lymphocytes Relative: 25 % (ref 12–46)
Lymphs Abs: 1.5 K/uL (ref 0.7–4.0)
MCH: 29.2 pg (ref 26.0–34.0)
MCHC: 33.8 g/dL (ref 30.0–36.0)
MCV: 86.4 fL (ref 78.0–100.0)
MPV: 9.5 fL (ref 8.6–12.4)
Monocytes Absolute: 0.4 K/uL (ref 0.1–1.0)
Monocytes Relative: 6 % (ref 3–12)
Neutro Abs: 3.8 K/uL (ref 1.7–7.7)
Neutrophils Relative %: 64 % (ref 43–77)
Platelets: 264 K/uL (ref 150–400)
RBC: 4.69 MIL/uL (ref 4.22–5.81)
RDW: 13.7 % (ref 11.5–15.5)
WBC: 6 K/uL (ref 4.0–10.5)

## 2014-08-09 LAB — POCT GLYCOSYLATED HEMOGLOBIN (HGB A1C): HEMOGLOBIN A1C: 7.2

## 2014-08-09 MED ORDER — TERBINAFINE HCL 250 MG PO TABS: 250.0000 mg | ORAL_TABLET | Freq: Every day | ORAL | Status: DC

## 2014-08-09 NOTE — Patient Instructions (Addendum)
Start checking 2 hours after some meals as well. Take a total of 40 mg of Prilosec daily and if you continue to have swallowing symptoms, call me for referral

## 2014-08-09 NOTE — Progress Notes (Signed)
Subjective:    Patient ID: Marcus Holland, male    DOB: 16-Oct-1965, 49 y.o.   MRN: [id number]  Marcus Nevinvy J Guerreiro is a 49 y.o. male who presents for follow-up of Type 2 diabetes mellitus and for a complete examination. Today he complains of pain on the calcaneal area of the left foot when he bends over. No weakness, numbness or tingling.He also notes right index finger discomfort. No injury to the finger. Oh other joints are involved. He also states that he has a 6 month history of worsening of his reflux symptoms. He feels as if he is having pain and food gets stuck in the mid chest area. Solids do tend to bother him more than liquids.  Home blood sugar records: Patient test one time a day Current symptoms/problems None Daily foot checks:   Any foot concerns: big toes nail fungus.some of the toenails have become thickened and they're starting to cause difficulty. Exercise: on feet all day long Eyes: 1/2015he does plan on setting up another eye exam in the near future. The following portions of the patient's history were reviewed and updated as appropriate: allergies, current medications, past medical history, past social history and problem list.  ROS as in subjective above.     Objective:    Physical Exam BP 124/84 mmHg  Pulse 76  Ht 6\' 1"  (1.854 m)  Wt 232 lb (105.235 kg)  BMI 30.62 kg/m2  SpO2 97%  General Appearance:    Alert, cooperative, no distress, appears stated age  Head:    Normocephalic, without obvious abnormality, atraumatic  Eyes:    PERRL, conjunctiva/corneas clear, EOM's intact, fundi    Not examined  Ears:    Normal TM's and external ear canals  Nose:   Nares normal, mucosa normal, no drainage or sinus   tenderness  Throat:   Lips, mucosa, and tongue normal; teeth and gums normal  Neck:   Supple, no lymphadenopathy;  thyroid:  no   enlargement/tenderness/nodules; no carotid   bruit or JVD  Back:    Spine nontender, no curvature, ROM normal, no CVA     Tenderness.negative  straight leg raising with normal DTRs.   Lungs:     Clear to auscultation bilaterally without wheezes, rales or     ronchi; respirations unlabored  Chest Wall:    No tenderness or deformity   Heart:    Regular rate and rhythm, S1 and S2 normal, no murmur, rub   or gallop  Breast Exam:    No chest wall tenderness, masses or gynecomastia  Abdomen:     Soft, non-tender, nondistended, normoactive bowel sounds,    no masses, no hepatosplenomegaly        Extremities:   No clubbing, cyanosis or edema.foot exam shows no tenderness over calcaneal spur with normal motion of the foot.  Pulses:   2+ and symmetric all extremities  Skin:   Skin color, texture, turgor normal, no rashes ,thickening of the toenails is noted bilaterally  Lymph nodes:   Cervical, supraclavicular, and axillary nodes normal  Neurologic:   CNII-XII intact, normal strength, sensation and gait; reflexes 2+ and symmetric throughout          Psych:   Normal mood, affect, hygiene and grooming.    Lab Review Diabetic Labs Latest Ref Rng 11/17/2013 05/03/2013 01/10/2012 05/06/2008  HbA1c - 7.2 10.5(H) - -  Chol 0 - 200 mg/dL 409123 811(B247(H) 147(W233(H) -  HDL >39 mg/dL 29(F37(L) 62(Z38(L) 30(Q36(L) -  Calc LDL 0 -  99 mg/dL 69 161(W) 960(A) -  Triglycerides <150 mg/dL 85 540(J) 811(B) -  Creatinine 0.50 - 1.35 mg/dL - 1.47 8.29 5.62   BP/Weight 11/17/2013 08/11/2013 07/05/2013 06/07/2013 05/03/2013  Systolic BP 122 120 140 170 152  Diastolic BP 82 90 98 110 112  Wt. (Lbs) 230 232 229 232 229  BMI 30.35 30.62 30.22 30.62 -   Hemoglobin A1c 7.2  Jaylend  reports that he has never smoked. He does not have any smokeless tobacco history on file. He reports that he does not drink alcohol or use illicit drugs.     Assessment & Plan:    Routine general medical examination at a health care facility - Plan: CBC with Differential/Platelet, Comprehensive metabolic panel, Lipid panel  Hypertension associated with diabetes - Plan: CBC with Differential/Platelet,  Comprehensive metabolic panel  Hyperlipidemia LDL goal <70 - Plan: Lipid panel  Gastroesophageal reflux disease without esophagitis  Obesity (BMI 30-39.9)  Type 2 diabetes mellitus without complication - Plan: POCT glycosylated hemoglobin (Hb A1C), POCT UA - Microalbumin, CBC with Differential/Platelet, Comprehensive metabolic panel, Lipid panel  Onychomycosis - Plan: terbinafine (LAMISIL) 250 MG tablet   1. Rx changes: Lamisil 250 mg daily for the next several months. 2. Education: Reviewed 'ABCs' of diabetes management (respective goals in parentheses):  A1C (<7), blood pressure (<130/80), and cholesterol (LDL <100). 3. Compliance at present is estimated to be good. Efforts to improve compliance (if necessary) will be directed at making dietary modifications as much is possible.. 4. Follow up: 4 months  5. Also discussed his reflux disease and the need for possible GI referral for evaluation of stricture. He would like to wait and see if Nexium helps. Also discussed the use of Lamisil and possible side effects and benefits. He is willing to give this a try. Follow-up with his next appointment. Also discussed the lack of symptoms with his feet in regard to bending forward and index finger. He is comfortable with no major intervention at this point.

## 2014-08-10 LAB — COMPREHENSIVE METABOLIC PANEL
ALK PHOS: 75 U/L (ref 39–117)
ALT: 32 U/L (ref 0–53)
AST: 18 U/L (ref 0–37)
Albumin: 4.5 g/dL (ref 3.5–5.2)
BILIRUBIN TOTAL: 0.6 mg/dL (ref 0.2–1.2)
BUN: 11 mg/dL (ref 6–23)
CO2: 28 mEq/L (ref 19–32)
Calcium: 9.3 mg/dL (ref 8.4–10.5)
Chloride: 103 mEq/L (ref 96–112)
Creat: 0.83 mg/dL (ref 0.50–1.35)
Glucose, Bld: 154 mg/dL — ABNORMAL HIGH (ref 70–99)
Potassium: 3.6 mEq/L (ref 3.5–5.3)
SODIUM: 141 meq/L (ref 135–145)
TOTAL PROTEIN: 6.5 g/dL (ref 6.0–8.3)

## 2014-08-10 LAB — LIPID PANEL
CHOLESTEROL: 125 mg/dL (ref 0–200)
HDL: 35 mg/dL — ABNORMAL LOW (ref >=40)
LDL CALC: 71 mg/dL (ref 0–99)
Total CHOL/HDL Ratio: 3.6 Ratio
Triglycerides: 96 mg/dL (ref <150)
VLDL: 19 mg/dL (ref 0–40)

## 2014-08-19 NOTE — Addendum Note (Signed)
Addended by: Ronnald NianLALONDE, JOHN C on: 08/19/2014 11:15 AM   Modules accepted: Level of Service

## 2014-08-27 ENCOUNTER — Other Ambulatory Visit: Payer: Self-pay | Admitting: Family Medicine

## 2014-08-29 ENCOUNTER — Telehealth: Payer: Self-pay

## 2014-08-30 MED ORDER — METFORMIN HCL ER 750 MG PO TB24: 750.0000 mg | ORAL_TABLET | Freq: Two times a day (BID) | ORAL | Status: DC

## 2014-08-30 NOTE — Telephone Encounter (Signed)
[facility]ill request for metformin ER 750mg  [facility]

## 2014-08-30 NOTE — Telephone Encounter (Signed)
done

## 2014-11-11 ENCOUNTER — Other Ambulatory Visit: Payer: Self-pay | Admitting: Family Medicine

## 2014-11-14 ENCOUNTER — Telehealth: Payer: Self-pay | Admitting: Family Medicine

## 2014-11-14 NOTE — Telephone Encounter (Signed)
Recv'd fax notification that pt has exceeded 12 weeks of therapy for terbinafine Form in your folder

## 2014-11-21 ENCOUNTER — Other Ambulatory Visit: Payer: Self-pay | Admitting: Family Medicine

## 2014-11-21 ENCOUNTER — Ambulatory Visit (INDEPENDENT_AMBULATORY_CARE_PROVIDER_SITE_OTHER): Payer: BLUE CROSS/BLUE SHIELD | Admitting: Family Medicine

## 2014-11-21 ENCOUNTER — Encounter: Payer: Self-pay | Admitting: Family Medicine

## 2014-11-21 VITALS — BP 120/78 | HR 76 | Wt 226.0 lb

## 2014-11-21 DIAGNOSIS — E1169 Type 2 diabetes mellitus with other specified complication: Secondary | ICD-10-CM

## 2014-11-21 DIAGNOSIS — E119 Type 2 diabetes mellitus without complications: Secondary | ICD-10-CM | POA: Diagnosis not present

## 2014-11-21 DIAGNOSIS — Z79899 Other long term (current) drug therapy: Secondary | ICD-10-CM

## 2014-11-21 DIAGNOSIS — I10 Essential (primary) hypertension: Secondary | ICD-10-CM | POA: Diagnosis not present

## 2014-11-21 DIAGNOSIS — R1010 Upper abdominal pain, unspecified: Secondary | ICD-10-CM | POA: Diagnosis not present

## 2014-11-21 DIAGNOSIS — E785 Hyperlipidemia, unspecified: Secondary | ICD-10-CM | POA: Diagnosis not present

## 2014-11-21 DIAGNOSIS — R1011 Right upper quadrant pain: Secondary | ICD-10-CM

## 2014-11-21 DIAGNOSIS — B351 Tinea unguium: Secondary | ICD-10-CM

## 2014-11-21 DIAGNOSIS — E669 Obesity, unspecified: Secondary | ICD-10-CM | POA: Diagnosis not present

## 2014-11-21 DIAGNOSIS — E1159 Type 2 diabetes mellitus with other circulatory complications: Secondary | ICD-10-CM

## 2014-11-21 DIAGNOSIS — I152 Hypertension secondary to endocrine disorders: Secondary | ICD-10-CM

## 2014-11-21 LAB — COMPREHENSIVE METABOLIC PANEL
ALBUMIN: 4.4 g/dL (ref 3.6–5.1)
ALT: 628 U/L — AB (ref 9–46)
AST: 292 U/L — AB (ref 10–35)
Alkaline Phosphatase: 218 U/L — ABNORMAL HIGH (ref 40–115)
BILIRUBIN TOTAL: 2.1 mg/dL — AB (ref 0.2–1.2)
BUN: 14 mg/dL (ref 7–25)
CO2: 27 mmol/L (ref 20–31)
Calcium: 9.3 mg/dL (ref 8.6–10.3)
Chloride: 102 mmol/L (ref 98–110)
Creat: 0.84 mg/dL (ref 0.70–1.33)
Glucose, Bld: 112 mg/dL — ABNORMAL HIGH (ref 65–99)
Potassium: 3.4 mmol/L — ABNORMAL LOW (ref 3.5–5.3)
Sodium: 140 mmol/L (ref 135–146)
Total Protein: 6.6 g/dL (ref 6.1–8.1)

## 2014-11-21 LAB — POCT GLYCOSYLATED HEMOGLOBIN (HGB A1C): HEMOGLOBIN A1C: 6.9

## 2014-11-21 NOTE — Progress Notes (Signed)
   Subjective:    Patient ID: Marcus Holland, male    DOB: [birth year], 49 y.o.   MRN: [id number]  HPI He complains of onset of right upper quadrant pain Saturday evening. This occurred roughly 4 hours after eating beef with cheese. He has a previous history of difficulty with this but no evaluation. No nausea, vomiting, diarrhea, fever or chills. He does have underlying diabetes and does intermittently check his blood sugars. He also states he does check his feet periodically. His diet and exercise are unchanged. He does not smoke. Drinking was reviewed. He has had difficulty with onychomycosis and presently is on Lamisil. He does note some improvement with the nails on his feet.   Review of Systems     Objective:   Physical Exam Alert and in no distress. Cardiac exam shows regular rhythm without murmurs or gallops. Lungs are clear to auscultation. Abdominal exam shows normal bowel sounds with Murphy's punch being positive and Murphy's sign negative. Exam of his feet does show 50% improvement in his onychomycosis.  HbA1c 6.9     Assessment & Plan:  Postprandial RUQ pain - Plan: US Abdomen Limited RUQ  Hypertension associated with diabetes  Hyperlipidemia LDL goal <70  Obesity (BMI 30-39.9)  Type 2 diabetes mellitus without complication - Plan: HgB A1c  Onychomycosis - Plan: Comprehensive metabolic panel  Encounter for long-term (current) use of medications - Plan: Comprehensive metabolic panel  he seems to be doing fairly well with overall care although his physical activity level is limited. He will continue on his Lamisil. His right upper quadrant symptoms do sound like gallbladder disease.

## 2014-11-22 ENCOUNTER — Encounter: Payer: Self-pay | Admitting: Family Medicine

## 2014-11-22 ENCOUNTER — Ambulatory Visit
Admission: RE | Admit: 2014-11-22 | Discharge: 2014-11-22 | Disposition: A | Discharge status: Home / Self Care | Payer: BLUE CROSS/BLUE SHIELD | Source: Ambulatory Visit | Attending: Family Medicine | Admitting: Family Medicine

## 2014-11-22 DIAGNOSIS — R1011 Right upper quadrant pain: Secondary | ICD-10-CM

## 2014-11-22 LAB — HEPATITIS PANEL, ACUTE
HCV Ab: NEGATIVE
HEP B C IGM: NONREACTIVE
Hep A IgM: NONREACTIVE
Hepatitis B Surface Ag: NEGATIVE

## 2014-11-23 ENCOUNTER — Telehealth: Payer: Self-pay | Admitting: Family Medicine

## 2014-11-23 NOTE — Telephone Encounter (Signed)
Pt called and stated that he is uncomfortable today. He didn't feel that way yesterday. He states he has a dull ache. He wants to know if this is something to be concerned about. Please advise pt. Pt can be reached at [phone].

## 2014-12-02 NOTE — Telephone Encounter (Signed)
12/02/2014

## 2014-12-17 ENCOUNTER — Other Ambulatory Visit: Payer: Self-pay | Admitting: Family Medicine

## 2014-12-20 ENCOUNTER — Other Ambulatory Visit: Payer: Self-pay | Admitting: Family Medicine

## 2014-12-20 MED ORDER — LISINOPRIL-HYDROCHLOROTHIAZIDE 20-12.5 MG PO TABS: 1.0000 | ORAL_TABLET | Freq: Every day | ORAL | Status: DC

## 2014-12-21 ENCOUNTER — Other Ambulatory Visit: Payer: Self-pay | Admitting: *Deleted

## 2014-12-21 MED ORDER — LISINOPRIL-HYDROCHLOROTHIAZIDE 20-12.5 MG PO TABS: 1.0000 | ORAL_TABLET | Freq: Every day | ORAL | Status: DC

## 2015-02-25 ENCOUNTER — Other Ambulatory Visit: Payer: Self-pay | Admitting: Family Medicine

## 2015-03-21 ENCOUNTER — Telehealth: Payer: Self-pay | Admitting: Family Medicine

## 2015-03-21 MED ORDER — TERAZOSIN HCL 1 MG PO CAPS: 1.0000 mg | ORAL_CAPSULE | Freq: Every day | ORAL | Status: DC

## 2015-03-21 NOTE — Telephone Encounter (Signed)
Requesting that refill of Terazosin 1mg  be sent to [facility]

## 2015-05-17 ENCOUNTER — Ambulatory Visit (INDEPENDENT_AMBULATORY_CARE_PROVIDER_SITE_OTHER): Payer: BLUE CROSS/BLUE SHIELD | Admitting: Medical

## 2015-05-17 ENCOUNTER — Encounter: Payer: Self-pay | Admitting: Medical

## 2015-05-17 VITALS — BP 110/80 | HR 75 | Wt 225.0 lb

## 2015-05-17 DIAGNOSIS — L600 Ingrowing nail: Secondary | ICD-10-CM

## 2015-05-17 NOTE — Addendum Note (Signed)
Addended by: Jac Canavan on: 05/17/2015 10:33 AM   Modules accepted: Level of Service

## 2015-05-17 NOTE — Progress Notes (Signed)
Subjective: Chief Complaint  Patient presents with  . ingrown toenail    lt big toe   Here for possible ingrown toenail of left great toe.   Normally sees Dr. Susann Givens here.  Has hx/o diabetes, poor compliance in past, hypertension.  Has prior ingrowing toenails, including his self attempts to relieve ingrowing a month ago.  Cut the nail off a little last month to prevent this, but toenail seems to c/t to ingrow.  He notes left toe pain, swelling at nailbed, but no pus.   A month ago has some pus drainage.   He notes prior toenail fungus where the nail use to raise up a bit.   Didn't really have problems with ingrowing until the nail fungus.  No other aggravating or relieving factors. No other complaint.  Past Medical History  Diagnosis Date  . Elevated lipids   . Hypertension   . Obesity   . Poor compliance   . Diabetes mellitus without complication (HCC)    ROS as in subjective  Objective: BP 110/80 mmHg  Pulse 75  Wt 225 lb (102.059 kg)  Gen: wd, wn, nad Left great toe with medial nail bed swollen, erythema, slight pus, nail ingrowing, tender.   Right great toenail ingrowing without erythema or infection.   Nails in general are cut straight across Feet neurovascularly intact   Assessment: Encounter Diagnosis  Name Primary?  . Ingrown left big toenail Yes    Plan: Discussed findings, options for going forward, including podiatry referral, antibiotics and watch and wait, or procedure.   He consents to procedure.    Procedure: Cleaned and prepped left great toe in usual sterile fashion, used 3.5cc of 1% lidocaine without epinephrine for a digital block of the left great toe, used a hemostat and scissors to remove the medial fifth of the nail, irrigated with high-pressure saline, covered with sterile bandage. Patient tolerated procedure well, less than 3 cc blood loss.  Advised they leave bandage on for 2-3 days, then can use Epsom salt soaks, keep wound clean and dry.  Discussed  proper nail cutting for fingers and toes.  Discussed signs of infection, and they will call if worse or not improving.   Follow up 1wk.

## 2015-05-27 ENCOUNTER — Other Ambulatory Visit: Payer: Self-pay | Admitting: Family Medicine

## 2015-07-11 ENCOUNTER — Telehealth: Payer: Self-pay | Admitting: Family Medicine

## 2015-07-11 NOTE — Telephone Encounter (Signed)
Called pt to setup appt for a diabetes check per jcl has not been in a while, told him to call back,

## 2015-08-11 ENCOUNTER — Ambulatory Visit (INDEPENDENT_AMBULATORY_CARE_PROVIDER_SITE_OTHER): Payer: BLUE CROSS/BLUE SHIELD | Admitting: Family Medicine

## 2015-08-11 ENCOUNTER — Encounter: Payer: Self-pay | Admitting: Family Medicine

## 2015-08-11 VITALS — BP 122/86 | HR 64 | Ht 73.0 in | Wt 225.8 lb

## 2015-08-11 DIAGNOSIS — Z0000 Encounter for general adult medical examination without abnormal findings: Secondary | ICD-10-CM

## 2015-08-11 DIAGNOSIS — K219 Gastro-esophageal reflux disease without esophagitis: Secondary | ICD-10-CM

## 2015-08-11 DIAGNOSIS — Z79899 Other long term (current) drug therapy: Secondary | ICD-10-CM | POA: Diagnosis not present

## 2015-08-11 DIAGNOSIS — E669 Obesity, unspecified: Secondary | ICD-10-CM

## 2015-08-11 DIAGNOSIS — I10 Essential (primary) hypertension: Secondary | ICD-10-CM | POA: Diagnosis not present

## 2015-08-11 DIAGNOSIS — E118 Type 2 diabetes mellitus with unspecified complications: Secondary | ICD-10-CM | POA: Diagnosis not present

## 2015-08-11 DIAGNOSIS — Z1159 Encounter for screening for other viral diseases: Secondary | ICD-10-CM

## 2015-08-11 DIAGNOSIS — I152 Hypertension secondary to endocrine disorders: Secondary | ICD-10-CM

## 2015-08-11 DIAGNOSIS — E785 Hyperlipidemia, unspecified: Secondary | ICD-10-CM

## 2015-08-11 DIAGNOSIS — E1159 Type 2 diabetes mellitus with other circulatory complications: Secondary | ICD-10-CM

## 2015-08-11 LAB — LIPID PANEL
Cholesterol: 139 mg/dL (ref 125–200)
HDL: 39 mg/dL — ABNORMAL LOW (ref >=40)
LDL Cholesterol: 82 mg/dL (ref <130)
TRIGLYCERIDES: 89 mg/dL (ref <150)
Total CHOL/HDL Ratio: 3.6 Ratio (ref <=5.0)
VLDL: 18 mg/dL (ref <30)

## 2015-08-11 LAB — COMPREHENSIVE METABOLIC PANEL
ALK PHOS: 88 U/L (ref 40–115)
ALT: 27 U/L (ref 9–46)
AST: 18 U/L (ref 10–35)
Albumin: 4.4 g/dL (ref 3.6–5.1)
BUN: 17 mg/dL (ref 7–25)
CO2: 29 mmol/L (ref 20–31)
Calcium: 9.3 mg/dL (ref 8.6–10.3)
Chloride: 104 mmol/L (ref 98–110)
Creat: 0.94 mg/dL (ref 0.70–1.33)
GLUCOSE: 154 mg/dL — AB (ref 65–99)
POTASSIUM: 3.5 mmol/L (ref 3.5–5.3)
SODIUM: 142 mmol/L (ref 135–146)
Total Bilirubin: 0.6 mg/dL (ref 0.2–1.2)
Total Protein: 6.6 g/dL (ref 6.1–8.1)

## 2015-08-11 LAB — CBC WITH DIFFERENTIAL/PLATELET
BASOS PCT: 1 %
Basophils Absolute: 60 cells/uL (ref 0–200)
EOS PCT: 6 %
Eosinophils Absolute: 360 cells/uL (ref 15–500)
HCT: 39.8 % (ref 38.5–50.0)
Hemoglobin: 13.4 g/dL (ref 13.2–17.1)
LYMPHS PCT: 30 %
Lymphs Abs: 1800 cells/uL (ref 850–3900)
MCH: 29.2 pg (ref 27.0–33.0)
MCHC: 33.7 g/dL (ref 32.0–36.0)
MCV: 86.7 fL (ref 80.0–100.0)
MONOS PCT: 8 %
MPV: 9.4 fL (ref 7.5–12.5)
Monocytes Absolute: 480 cells/uL (ref 200–950)
Neutro Abs: 3300 cells/uL (ref 1500–7800)
Neutrophils Relative %: 55 %
PLATELETS: 238 10*3/uL (ref 140–400)
RBC: 4.59 MIL/uL (ref 4.20–5.80)
RDW: 13.5 % (ref 11.0–15.0)
WBC: 6 10*3/uL (ref 4.0–10.5)

## 2015-08-11 LAB — POCT GLYCOSYLATED HEMOGLOBIN (HGB A1C): HEMOGLOBIN A1C: 7

## 2015-08-11 MED ORDER — METFORMIN HCL ER 750 MG PO TB24: 750.0000 mg | ORAL_TABLET | Freq: Two times a day (BID) | ORAL | Status: DC

## 2015-08-11 MED ORDER — TERAZOSIN HCL 1 MG PO CAPS: 1.0000 mg | ORAL_CAPSULE | Freq: Every day | ORAL | Status: DC

## 2015-08-11 MED ORDER — ATORVASTATIN CALCIUM 20 MG PO TABS: 20.0000 mg | ORAL_TABLET | Freq: Every day | ORAL | Status: DC

## 2015-08-11 MED ORDER — LISINOPRIL-HYDROCHLOROTHIAZIDE 20-12.5 MG PO TABS: 1.0000 | ORAL_TABLET | Freq: Every day | ORAL | Status: DC

## 2015-08-11 MED ORDER — AMLODIPINE BESYLATE 10 MG PO TABS: 10.0000 mg | ORAL_TABLET | Freq: Every day | ORAL | Status: DC

## 2015-08-11 NOTE — Progress Notes (Signed)
Subjective:    Patient ID: Marcus Holland, male    DOB: 01/22/1966, 50 y.o.   MRN: [id number]  HPI He is here for complete examination. He does have underlying diabetes however has not been seen here in several months.he does periodically check his blood sugars. He does check his feet regularly. His not had an eye exam but does plan to get one. He continues on medications listed in the chart. His physical activity is limited to working. He has very little difficulty from his reflux symptoms.He does not smoke or drink. Medications were reviewed. He recently did have gallbladder surgery and is doing fairly well from that perspective. Family and social history as well as health maintenance and immunizations were reviewed. He's had no chest pain, shortness of breath, GI issues.   Review of Systems  All other systems reviewed and are negative.      Objective:   Physical Exam BP 122/86 mmHg  Pulse 64  Ht 6\' 1"  (1.854 m)  Wt 225 lb 12.8 oz (102.422 kg)  BMI 29.80 kg/m2  General Appearance:    Alert, cooperative, no distress, appears stated age  Head:    Normocephalic, without obvious abnormality, atraumatic  Eyes:    PERRL, conjunctiva/corneas clear, EOM's intact, fundi    benign  Ears:    Normal TM's and external ear canals  Nose:   Nares normal, mucosa normal, no drainage or sinus   tenderness  Throat:   Lips, mucosa, and tongue normal; teeth and gums normal  Neck:   Supple, no lymphadenopathy;  thyroid:  no   enlargement/tenderness/nodules; no carotid   bruit or JVD  Back:    Spine nontender, no curvature, ROM normal, no CVA     tenderness  Lungs:     Clear to auscultation bilaterally without wheezes, rales or     ronchi; respirations unlabored  Chest Wall:    No tenderness or deformity   Heart:    Regular rate and rhythm, S1 and S2 normal, no murmur, rub   or gallop  Breast Exam:    No chest wall tenderness, masses or gynecomastia  Abdomen:     Soft, non-tender, nondistended,  normoactive bowel sounds,    no masses, no hepatosplenomegaly  Genitalia:    Normal male external genitalia without lesions.  Testicles without masses.  No inguinal hernias.  Rectal:    Normal sphincter tone, no masses or tenderness; guaiac negative stool.  Prostate smooth, no nodules, not enlarged.  Extremities:   No clubbing, cyanosis or edema  Pulses:   2+ and symmetric all extremities  Skin:   Skin color, texture, turgor normal, no rashes or lesions  Lymph nodes:   Cervical, supraclavicular, and axillary nodes normal  Neurologic:   CNII-XII intact, normal strength, sensation and gait; reflexes 2+ and symmetric throughout          Psych:   Normal mood, affect, hygiene and grooming.   A1c is 7.0       Assessment & Plan:  Routine general medical examination at a health care facility - Plan: CBC with Differential/Platelet, Comprehensive metabolic panel, Lipid panel  Hypertension associated with diabetes (HCC) - Plan: CBC with Differential/Platelet, Comprehensive metabolic panel, lisinopril-hydrochlorothiazide (PRINZIDE,ZESTORETIC) 20-12.5 MG tablet, terazosin (HYTRIN) 1 MG capsule  Hyperlipidemia LDL goal <70 - Plan: Lipid panel  Type 2 diabetes mellitus with complication, without long-term current use of insulin (HCC) - Plan: POCT glycosylated hemoglobin (Hb A1C), CBC with Differential/Platelet, Comprehensive metabolic panel, Lipid panel, Ambulatory referral  to Ophthalmology, CANCELED: POCT UA - Microalbumin  Gastroesophageal reflux disease, esophagitis presence not specified  Obesity (BMI 30-39.9) - Plan: CBC with Differential/Platelet, Comprehensive metabolic panel, Lipid panel  Encounter for long-term (current) use of medications - Plan: CBC with Differential/Platelet, Comprehensive metabolic panel, Lipid panel  Need for hepatitis C screening test - Plan: Hepatitis C antibody encouraged him to remain physically active and continue to check his blood sugars. So far seem be going  well

## 2015-08-12 LAB — HEPATITIS C ANTIBODY: HCV Ab: NEGATIVE

## 2015-09-14 ENCOUNTER — Other Ambulatory Visit: Payer: BLUE CROSS/BLUE SHIELD

## 2015-09-14 ENCOUNTER — Other Ambulatory Visit: Payer: Self-pay | Admitting: Physical Medicine and Rehabilitation

## 2015-09-14 DIAGNOSIS — M170 Bilateral primary osteoarthritis of knee: Secondary | ICD-10-CM

## 2016-02-05 ENCOUNTER — Other Ambulatory Visit: Payer: Self-pay

## 2016-02-05 ENCOUNTER — Telehealth: Payer: Self-pay

## 2016-02-05 DIAGNOSIS — I152 Hypertension secondary to endocrine disorders: Secondary | ICD-10-CM

## 2016-02-05 DIAGNOSIS — E1159 Type 2 diabetes mellitus with other circulatory complications: Secondary | ICD-10-CM

## 2016-02-05 DIAGNOSIS — I10 Essential (primary) hypertension: Principal | ICD-10-CM

## 2016-02-05 MED ORDER — LISINOPRIL-HYDROCHLOROTHIAZIDE 20-12.5 MG PO TABS: 1.0000 | ORAL_TABLET | Freq: Every day | ORAL | 1 refills | Status: DC

## 2016-02-05 NOTE — Telephone Encounter (Signed)
Sent!

## 2016-02-05 NOTE — Telephone Encounter (Signed)
[facility]

## 2016-06-24 ENCOUNTER — Other Ambulatory Visit: Payer: Self-pay | Admitting: Family Medicine

## 2016-10-02 ENCOUNTER — Other Ambulatory Visit: Payer: Self-pay | Admitting: Family Medicine

## 2016-10-24 ENCOUNTER — Telehealth: Payer: Self-pay | Admitting: Family Medicine

## 2016-10-24 ENCOUNTER — Other Ambulatory Visit: Payer: Self-pay

## 2016-10-24 DIAGNOSIS — I152 Hypertension secondary to endocrine disorders: Secondary | ICD-10-CM

## 2016-10-24 DIAGNOSIS — E1159 Type 2 diabetes mellitus with other circulatory complications: Secondary | ICD-10-CM

## 2016-10-24 DIAGNOSIS — I10 Essential (primary) hypertension: Principal | ICD-10-CM

## 2016-10-24 MED ORDER — ATORVASTATIN CALCIUM 20 MG PO TABS: 20.0000 mg | ORAL_TABLET | Freq: Every day | ORAL | 0 refills | Status: DC

## 2016-10-24 MED ORDER — TERAZOSIN HCL 1 MG PO CAPS: 1.0000 mg | ORAL_CAPSULE | Freq: Every day | ORAL | 0 refills | Status: DC

## 2016-10-24 NOTE — Telephone Encounter (Signed)
[facility] requesting refill for Lisinopril/HCTz #90

## 2016-10-24 NOTE — Telephone Encounter (Signed)
Left message for pt to make appointment

## 2016-10-24 NOTE — Telephone Encounter (Signed)
Also refill request for Terazosin HCL #90

## 2016-10-24 NOTE — Telephone Encounter (Signed)
Left message pt needs appointment been over 1 year since he has been in also sent meds in

## 2016-11-05 ENCOUNTER — Telehealth: Payer: Self-pay

## 2016-11-05 ENCOUNTER — Other Ambulatory Visit: Payer: Self-pay

## 2016-11-05 DIAGNOSIS — E1159 Type 2 diabetes mellitus with other circulatory complications: Secondary | ICD-10-CM

## 2016-11-05 DIAGNOSIS — I10 Essential (primary) hypertension: Principal | ICD-10-CM

## 2016-11-05 DIAGNOSIS — I152 Hypertension secondary to endocrine disorders: Secondary | ICD-10-CM

## 2016-11-05 MED ORDER — LISINOPRIL-HYDROCHLOROTHIAZIDE 20-12.5 MG PO TABS: 1.0000 | ORAL_TABLET | Freq: Every day | ORAL | 0 refills | Status: DC

## 2016-11-05 NOTE — Telephone Encounter (Signed)
Refill request rcvd from [facility] for refill of lisinopril/hctz. [name]/RLB

## 2016-11-05 NOTE — Telephone Encounter (Signed)
I have left messages for pt it has been a year time for appointment

## 2016-12-09 ENCOUNTER — Other Ambulatory Visit: Payer: Self-pay | Admitting: Family Medicine

## 2016-12-10 ENCOUNTER — Telehealth: Payer: Self-pay

## 2016-12-10 DIAGNOSIS — I152 Hypertension secondary to endocrine disorders: Secondary | ICD-10-CM

## 2016-12-10 DIAGNOSIS — E1159 Type 2 diabetes mellitus with other circulatory complications: Secondary | ICD-10-CM

## 2016-12-10 DIAGNOSIS — I10 Essential (primary) hypertension: Principal | ICD-10-CM

## 2016-12-10 MED ORDER — LISINOPRIL-HYDROCHLOROTHIAZIDE 20-12.5 MG PO TABS: 1.0000 | ORAL_TABLET | Freq: Every day | ORAL | 0 refills | Status: DC

## 2016-12-10 NOTE — Telephone Encounter (Signed)
Script for Lisinopril/HCTZ denied until pt calls the office back to schedule appt for DM check.  IT has been over 1 year since last OV. LM for pt to CB. [name]

## 2016-12-10 NOTE — Telephone Encounter (Signed)
SPoke with Marcus Holland- scheduled appt for OCT- rx renewed for 90 days. [name]

## 2017-01-20 ENCOUNTER — Other Ambulatory Visit: Payer: Self-pay | Admitting: Family Medicine

## 2017-01-20 ENCOUNTER — Telehealth: Payer: Self-pay | Admitting: Family Medicine

## 2017-01-20 MED ORDER — ATORVASTATIN CALCIUM 20 MG PO TABS: 20.0000 mg | ORAL_TABLET | Freq: Every day | ORAL | 0 refills | Status: DC

## 2017-01-20 MED ORDER — AMLODIPINE BESYLATE 10 MG PO TABS: 10.0000 mg | ORAL_TABLET | Freq: Every day | ORAL | 0 refills | Status: DC

## 2017-01-20 NOTE — Telephone Encounter (Signed)
Refilled both.

## 2017-01-20 NOTE — Telephone Encounter (Signed)
Pt called requesting refills on meds that were denied at [facility]. Amlodipine & Atorvastatin?? Nedd to be refilled to [facility]

## 2017-01-26 ENCOUNTER — Other Ambulatory Visit: Payer: Self-pay | Admitting: Family Medicine

## 2017-01-27 MED ORDER — AMLODIPINE BESYLATE 10 MG PO TABS: 10.0000 mg | ORAL_TABLET | Freq: Every day | ORAL | 0 refills | Status: DC

## 2017-01-27 MED ORDER — ATORVASTATIN CALCIUM 20 MG PO TABS: 20.0000 mg | ORAL_TABLET | Freq: Every day | ORAL | 0 refills | Status: DC

## 2017-01-27 NOTE — Telephone Encounter (Signed)
Pt has an appt at the end of this month for cpe. Will refill med

## 2017-02-12 ENCOUNTER — Other Ambulatory Visit: Payer: Self-pay | Admitting: Family Medicine

## 2017-02-12 DIAGNOSIS — I152 Hypertension secondary to endocrine disorders: Secondary | ICD-10-CM

## 2017-02-12 DIAGNOSIS — E1159 Type 2 diabetes mellitus with other circulatory complications: Secondary | ICD-10-CM

## 2017-02-12 DIAGNOSIS — I10 Essential (primary) hypertension: Principal | ICD-10-CM

## 2017-02-12 MED ORDER — TERAZOSIN HCL 1 MG PO CAPS: 1.0000 mg | ORAL_CAPSULE | Freq: Every day | ORAL | 0 refills | Status: DC

## 2017-02-18 ENCOUNTER — Other Ambulatory Visit: Payer: Self-pay

## 2017-02-18 ENCOUNTER — Encounter: Payer: Self-pay | Admitting: Family Medicine

## 2017-02-18 ENCOUNTER — Ambulatory Visit (INDEPENDENT_AMBULATORY_CARE_PROVIDER_SITE_OTHER): Payer: BLUE CROSS/BLUE SHIELD | Admitting: Family Medicine

## 2017-02-18 VITALS — BP 130/80 | HR 75 | Ht 72.0 in | Wt 220.8 lb

## 2017-02-18 DIAGNOSIS — E1159 Type 2 diabetes mellitus with other circulatory complications: Secondary | ICD-10-CM | POA: Diagnosis not present

## 2017-02-18 DIAGNOSIS — E118 Type 2 diabetes mellitus with unspecified complications: Secondary | ICD-10-CM

## 2017-02-18 DIAGNOSIS — E669 Obesity, unspecified: Secondary | ICD-10-CM | POA: Diagnosis not present

## 2017-02-18 DIAGNOSIS — E785 Hyperlipidemia, unspecified: Secondary | ICD-10-CM | POA: Diagnosis not present

## 2017-02-18 DIAGNOSIS — Z23 Encounter for immunization: Secondary | ICD-10-CM | POA: Diagnosis not present

## 2017-02-18 DIAGNOSIS — K219 Gastro-esophageal reflux disease without esophagitis: Secondary | ICD-10-CM | POA: Diagnosis not present

## 2017-02-18 DIAGNOSIS — I10 Essential (primary) hypertension: Secondary | ICD-10-CM | POA: Diagnosis not present

## 2017-02-18 DIAGNOSIS — Z0000 Encounter for general adult medical examination without abnormal findings: Secondary | ICD-10-CM | POA: Diagnosis not present

## 2017-02-18 DIAGNOSIS — E1169 Type 2 diabetes mellitus with other specified complication: Secondary | ICD-10-CM

## 2017-02-18 DIAGNOSIS — I152 Hypertension secondary to endocrine disorders: Secondary | ICD-10-CM

## 2017-02-18 LAB — COMPREHENSIVE METABOLIC PANEL
AG Ratio: 2 (calc) (ref 1.0–2.5)
ALT: 18 U/L (ref 9–46)
AST: 13 U/L (ref 10–35)
Albumin: 4.5 g/dL (ref 3.6–5.1)
Alkaline phosphatase (APISO): 90 U/L (ref 40–115)
BUN: 19 mg/dL (ref 7–25)
CALCIUM: 9.6 mg/dL (ref 8.6–10.3)
CO2: 30 mmol/L (ref 20–32)
Chloride: 103 mmol/L (ref 98–110)
Creat: 0.83 mg/dL (ref 0.70–1.33)
Globulin: 2.2 g/dL (calc) (ref 1.9–3.7)
Glucose, Bld: 173 mg/dL — ABNORMAL HIGH (ref 65–99)
Potassium: 4.1 mmol/L (ref 3.5–5.3)
Sodium: 142 mmol/L (ref 135–146)
Total Bilirubin: 0.7 mg/dL (ref 0.2–1.2)
Total Protein: 6.7 g/dL (ref 6.1–8.1)

## 2017-02-18 LAB — CBC WITH DIFFERENTIAL/PLATELET
Basophils Absolute: 43 cells/uL (ref 0–200)
Basophils Relative: 0.6 %
EOS ABS: 331 {cells}/uL (ref 15–500)
Eosinophils Relative: 4.6 %
HCT: 42.4 % (ref 38.5–50.0)
Hemoglobin: 14.6 g/dL (ref 13.2–17.1)
LYMPHS ABS: 1706 {cells}/uL (ref 850–3900)
MCH: 29.2 pg (ref 27.0–33.0)
MCHC: 34.4 g/dL (ref 32.0–36.0)
MCV: 84.8 fL (ref 80.0–100.0)
MONOS PCT: 5.3 %
MPV: 10.2 fL (ref 7.5–12.5)
Neutro Abs: 4738 cells/uL (ref 1500–7800)
Neutrophils Relative %: 65.8 %
Platelets: 262 10*3/uL (ref 140–400)
RBC: 5 10*6/uL (ref 4.20–5.80)
RDW: 12.8 % (ref 11.0–15.0)
Total Lymphocyte: 23.7 %
WBC: 7.2 10*3/uL (ref 3.8–10.8)
WBCMIX: 382 {cells}/uL (ref 200–950)

## 2017-02-18 LAB — LIPID PANEL
Cholesterol: 119 mg/dL (ref <200)
HDL: 41 mg/dL (ref >40)
LDL CHOLESTEROL (CALC): 60 mg/dL
Non-HDL Cholesterol (Calc): 78 mg/dL (calc) (ref <130)
Total CHOL/HDL Ratio: 2.9 (calc) (ref <5.0)
Triglycerides: 92 mg/dL (ref <150)

## 2017-02-18 LAB — HEMOGLOBIN A1C: Hemoglobin A1C: 8.7

## 2017-02-18 MED ORDER — AMLODIPINE BESYLATE 10 MG PO TABS: 10.0000 mg | ORAL_TABLET | Freq: Every day | ORAL | 3 refills | Status: DC

## 2017-02-18 MED ORDER — SITAGLIPTIN PHOS-METFORMIN HCL 50-1000 MG PO TABS: 1.0000 | ORAL_TABLET | Freq: Two times a day (BID) | ORAL | 1 refills | Status: DC

## 2017-02-18 MED ORDER — LISINOPRIL-HYDROCHLOROTHIAZIDE 20-12.5 MG PO TABS: 1.0000 | ORAL_TABLET | Freq: Every day | ORAL | 3 refills | Status: DC

## 2017-02-18 MED ORDER — ATORVASTATIN CALCIUM 20 MG PO TABS: 20.0000 mg | ORAL_TABLET | Freq: Every day | ORAL | 3 refills | Status: DC

## 2017-02-18 MED ORDER — TERAZOSIN HCL 1 MG PO CAPS: 1.0000 mg | ORAL_CAPSULE | Freq: Every day | ORAL | 3 refills | Status: DC

## 2017-02-18 NOTE — Progress Notes (Signed)
Subjective:    Patient ID: Marcus Holland, male    DOB: 04/05/1966, 51 y.o.   MRN: [id number]  HPI He is here for complete examination. He has not been in any year. He states that he did not know that he was supposed to be on Cerner. He states he continues on his metformin but his blood sugars have been in a 200 range postprandially. He continues on Hytrin, Norvasc as well as lisinopril/HCTZ and having no difficulty with that. He also is taking Lipitor and having no aches or pains. His job keeps him physically active although he is not involved in a regular exercise program. Is a previous history of reflux but no problem with that in quite some time. Scheduled for an eye exam in December of this year. Family and social history as well as health maintenance and immunizations were reviewed.   Review of Systems  All other systems reviewed and are negative.      Objective:   Physical Exam BP 130/80   Pulse 75   Ht 6' (1.829 m)   Wt 220 lb 12.8 oz (100.2 kg)   SpO2 97%   BMI 29.95 kg/m   General Appearance:    Alert, cooperative, no distress, appears stated age  Head:    Normocephalic, without obvious abnormality, atraumatic  Eyes:    PERRL, conjunctiva/corneas clear, EOM's intact, fundi    benign  Ears:    Normal TM's and external ear canals  Nose:   Nares normal, mucosa normal, no drainage or sinus   tenderness  Throat:   Lips, mucosa, and tongue normal; teeth and gums normal  Neck:   Supple, no lymphadenopathy;  thyroid:  no   enlargement/tenderness/nodules; no carotid   bruit or JVD     Lungs:     Clear to auscultation bilaterally without wheezes, rales or     ronchi; respirations unlabored      Heart:    Regular rate and rhythm, S1 and S2 normal, no murmur, rub   or gallop     Abdomen:     Soft, non-tender, nondistended, normoactive bowel sounds,    no masses, no hepatosplenomegaly  Genitalia:    Normal male external genitalia without lesions.  Testicles without masses.  No  inguinal hernias.  Rectal:    Deferred .  Extremities:   No clubbing, cyanosis or edema  Pulses:   2+ and symmetric all extremities  Skin:   Skin color, texture, turgor normal, no rashes or lesions  Lymph nodes:   Cervical, supraclavicular, and axillary nodes normal  Neurologic:   CNII-XII intact, normal strength, sensation and gait; reflexes 2+ and symmetric throughout          Psych:   Normal mood, affect, hygiene and grooming.    A1c is 8.7      Assessment & Plan:  Routine general medical examination at a health care facility - Plan: CBC with Differential/Platelet, Comprehensive metabolic panel, Lipid panel  Need for vaccination against Streptococcus pneumoniae - Plan: Pneumococcal conjugate vaccine 13-valent  Need for Tdap vaccination - Plan: Tdap vaccine greater than or equal to 7yo IM  Hypertension associated with diabetes (HCC) - Plan: lisinopril-hydrochlorothiazide (PRINZIDE,ZESTORETIC) 20-12.5 MG tablet, terazosin (HYTRIN) 1 MG capsule, amLODipine (NORVASC) 10 MG tablet, CBC with Differential/Platelet, Comprehensive metabolic panel  Type 2 diabetes mellitus with complication, without long-term current use of insulin (HCC) - Plan: sitaGLIPtin-metformin (JANUMET) 50-1000 MG tablet, CBC with Differential/Platelet, Comprehensive metabolic panel, Lipid panel, POCT UA - Microalbumin  Gastroesophageal reflux disease, esophagitis presence not specified  Obesity (BMI 30-39.9)  Hyperlipidemia associated with type 2 diabetes mellitus (HCC) - Plan: atorvastatin (LIPITOR) 20 MG tablet, Lipid panel His immunizations were updated. I will continue him on his blood pressure medication but will switch him to Janumet. Again encouraged him to make diet and exercise changes. Also discussed the need for him to come in 3 or 4 times per year. He will call if he has any difficulties with the Janumet.

## 2017-02-19 ENCOUNTER — Other Ambulatory Visit (INDEPENDENT_AMBULATORY_CARE_PROVIDER_SITE_OTHER): Payer: BLUE CROSS/BLUE SHIELD

## 2017-02-19 ENCOUNTER — Encounter: Payer: Self-pay | Admitting: Family Medicine

## 2017-02-19 DIAGNOSIS — E118 Type 2 diabetes mellitus with unspecified complications: Secondary | ICD-10-CM

## 2017-02-19 LAB — POCT UA - MICROALBUMIN
Albumin/Creatinine Ratio, Urine, POC: 13
CREATININE, POC: 45.4 mg/dL
Microalbumin Ur, POC: 28.6 mg/L

## 2017-02-20 ENCOUNTER — Other Ambulatory Visit: Payer: Self-pay | Admitting: Family Medicine

## 2017-02-20 MED ORDER — PIOGLITAZONE HCL 30 MG PO TABS: 30.0000 mg | ORAL_TABLET | Freq: Every day | ORAL | 1 refills | Status: DC

## 2017-02-20 MED ORDER — METFORMIN HCL 1000 MG PO TABS: 1000.0000 mg | ORAL_TABLET | Freq: Two times a day (BID) | ORAL | 1 refills | Status: DC

## 2017-02-20 NOTE — Telephone Encounter (Signed)
Janumet was over $300 per 90 days. I will switch him to Actos

## 2017-02-24 LAB — HM DIABETES EYE EXAM

## 2017-02-28 ENCOUNTER — Encounter: Payer: Self-pay | Admitting: Family Medicine

## 2017-03-28 DIAGNOSIS — H25041 Posterior subcapsular polar age-related cataract, right eye: Secondary | ICD-10-CM | POA: Diagnosis not present

## 2017-03-28 DIAGNOSIS — E119 Type 2 diabetes mellitus without complications: Secondary | ICD-10-CM | POA: Diagnosis not present

## 2017-03-28 LAB — HM DIABETES EYE EXAM

## 2017-04-07 ENCOUNTER — Encounter: Payer: Self-pay | Admitting: Family Medicine

## 2017-04-23 ENCOUNTER — Ambulatory Visit (INDEPENDENT_AMBULATORY_CARE_PROVIDER_SITE_OTHER): Payer: BLUE CROSS/BLUE SHIELD | Admitting: Family Medicine

## 2017-04-23 ENCOUNTER — Encounter: Payer: Self-pay | Admitting: Family Medicine

## 2017-04-23 VITALS — BP 124/84 | HR 74 | Ht 73.0 in | Wt 229.0 lb

## 2017-04-23 DIAGNOSIS — M79602 Pain in left arm: Secondary | ICD-10-CM | POA: Diagnosis not present

## 2017-04-23 NOTE — Progress Notes (Signed)
   Subjective:    Patient ID: Marcus Holland, male    DOB: 10-02-1965, 52 y.o.   MRN: [id number]  HPI He complains of 2 episodes of when he reached forward with his left arm, he had the acute onset of severe pain in the left arm ventral surface.  The pain would last for a short period of time and then go away.  He could not reproduce the pain.  No numbness, tingling, weakness.  He relates an incident over 10 years ago with trying to hold up an object it became too much to handle and he apparently had let go of the object.   Review of Systems     Objective:   Physical Exam Alert and in no distress.  Full motion of the shoulder without pain.  Negative drop arm testing.  Rotator cuff testing was all normal.  Pulses normal strength is normal.  Full motion of the elbow without pain.  No tenderness over medial or lateral epicondyle.       Assessment & Plan:  Left arm pain I explained that I did not find any particular area of concern.  Specifically mention I did not find any evidence of rotator cuff injury.  Recommend watchful waiting and see if he can identify a particular maneuver that might cause the symptoms recur.

## 2017-05-07 ENCOUNTER — Telehealth: Payer: Self-pay

## 2017-05-07 MED ORDER — METFORMIN HCL 1000 MG PO TABS: 1000.0000 mg | ORAL_TABLET | Freq: Two times a day (BID) | ORAL | 0 refills | Status: DC

## 2017-05-07 MED ORDER — PIOGLITAZONE HCL 30 MG PO TABS: 30.0000 mg | ORAL_TABLET | Freq: Every day | ORAL | 0 refills | Status: DC

## 2017-05-07 NOTE — Telephone Encounter (Signed)
Done

## 2017-05-07 NOTE — Telephone Encounter (Signed)
Fax came over on pioglitazone 30 mg needing to refilled.KH

## 2017-05-09 ENCOUNTER — Telehealth: Payer: Self-pay | Admitting: Family Medicine

## 2017-05-09 MED ORDER — PIOGLITAZONE HCL 30 MG PO TABS: 30.0000 mg | ORAL_TABLET | Freq: Every day | ORAL | 1 refills | Status: DC

## 2017-05-09 MED ORDER — METFORMIN HCL 1000 MG PO TABS: 1000.0000 mg | ORAL_TABLET | Freq: Two times a day (BID) | ORAL | 1 refills | Status: DC

## 2017-05-09 NOTE — Telephone Encounter (Signed)
   [facility]  Need new 90 day rx's for  Pioglitazone 30 mg  Metformin hcl 1000mg

## 2017-06-12 ENCOUNTER — Ambulatory Visit: Payer: BLUE CROSS/BLUE SHIELD | Admitting: Family Medicine

## 2017-06-12 ENCOUNTER — Encounter: Payer: Self-pay | Admitting: Family Medicine

## 2017-06-12 VITALS — BP 132/88 | HR 70 | Wt 227.2 lb

## 2017-06-12 DIAGNOSIS — E118 Type 2 diabetes mellitus with unspecified complications: Secondary | ICD-10-CM

## 2017-06-12 DIAGNOSIS — E669 Obesity, unspecified: Secondary | ICD-10-CM

## 2017-06-12 DIAGNOSIS — M79602 Pain in left arm: Secondary | ICD-10-CM

## 2017-06-12 DIAGNOSIS — E785 Hyperlipidemia, unspecified: Secondary | ICD-10-CM | POA: Diagnosis not present

## 2017-06-12 DIAGNOSIS — E1169 Type 2 diabetes mellitus with other specified complication: Secondary | ICD-10-CM | POA: Diagnosis not present

## 2017-06-12 LAB — POCT GLYCOSYLATED HEMOGLOBIN (HGB A1C): Hemoglobin A1C: 6.5

## 2017-06-12 MED ORDER — LIDOCAINE HCL 2 % IJ SOLN
10.0000 mL | Freq: Once | INTRAMUSCULAR | Status: AC
  Administered 2017-06-12: 200 mg via INTRADERMAL

## 2017-06-12 MED ORDER — TRIAMCINOLONE ACETONIDE 40 MG/ML IJ SUSP
40.0000 mg | Freq: Once | INTRAMUSCULAR | Status: AC
  Administered 2017-06-12: 40 mg via INTRAMUSCULAR

## 2017-06-12 NOTE — Progress Notes (Signed)
Subjective:    Patient ID: Marcus Holland, male    DOB: 1965-11-10, 52 y.o.   MRN: [id number]  Marcus Holland is a 52 y.o. male who presents for follow-up of Type 2 diabetes mellitus.  Patient is checking home blood sugars.   Home blood sugar records: meter records How often is blood sugars being checked: 2 to 3 times a week 160's Current symptoms/problems include none and have been unchanged. Daily foot checks: yes   Any foot concerns: no Last eye exam: nov of 2018 Exercise: walking 8 hours a day at work He continues on amlodipine, lisinopril/HCTZ and Hytrin for his blood pressure.  He is also taking pioglitazone and metformin for his sugars and doing well with that.  He notes difficulty with his left shoulder stating that abduction and external rotation is causing difficulty.  He also states that earlier today he did have his blood pressure checked and it was noted to be in the normal range. The following portions of the patient's history were reviewed and updated as appropriate: allergies, current medications, past medical history, past social history and problem list.  ROS as in subjective above.     Objective:    Physical Exam Alert and in no distress left shoulder exam shows full motion.  Drop arm test was uncomfortable.  Supraspinatus testing was negative.  Neer's and Hawkins test was equivocal..  Blood pressure 132/88, pulse 70, weight 227 lb 3.2 oz (103.1 kg), SpO2 98 %.  Lab Review Diabetic Labs Latest Ref Rng & Units 02/19/2017 02/18/2017 08/11/2015 11/21/2014 08/09/2014  HbA1c - - - 7.0 6.9 7.2  Microalbumin mg/L 28.6 - - - 5.5  Micro/Creat Ratio - 13.0 - - - 5.5  Chol <200 mg/dL - 725119 366139 - 440125  HDL >34>40 mg/dL - 41 74(Q39(L) - 59(D35(L)  Calc LDL <130 mg/dL - - 82 - 71  Triglycerides <150 mg/dL - 92 89 - 96  Creatinine 0.70 - 1.33 mg/dL - 6.380.83 7.560.94 4.330.84 2.950.83   BP/Weight 06/12/2017 04/23/2017 02/18/2017 08/11/2015 05/17/2015  Systolic BP 132 124 130 122 110  Diastolic BP 88 84 80 86 80  Wt.  (Lbs) 227.2 229 220.8 225.8 225  BMI 29.98 30.21 29.95 29.8 29.69   Foot/eye exam completion dates Latest Ref Rng & Units 03/28/2017 02/24/2017  Eye Exam No Retinopathy No Retinopathy No Retinopathy  Foot Form Completion - - -  A1c is 6.5  Marcus Holland  reports that  has never smoked. he has never used smokeless tobacco. He reports that he does not drink alcohol or use drugs.     Assessment & Plan:    Left arm pain  Obesity (BMI 30-39.9)  Hyperlipidemia associated with type 2 diabetes mellitus (HCC)  Hyperlipidemia LDL goal <70  Type 2 diabetes mellitus with complication, without long-term current use of insulin (HCC)   1. Rx changes: none 2. Education: Reviewed 'ABCs' of diabetes management (respective goals in parentheses):  A1C (<7), blood pressure (<130/80), and cholesterol (LDL <100). 3. Compliance at present is estimated to be good. Efforts to improve compliance (if necessary) will be directed at increased exercise. 4. Follow up: 4 months I discussed treatment of the left shoulder pain and we will try an injection to see if this will help with his symptoms.  I explained that his symptoms are not classic for rotator cuff but I think it is worth seeing if an injection would help.  He was comfortable with that.  The left shoulder was prepped in the  posterolateral area.  40 mg of Kenalog and 3 cc of Xylocaine was injected into the subacromial space without difficulty.  He will keep me informed as to his progress.

## 2017-06-12 NOTE — Addendum Note (Signed)
Addended by: Renelda LomaHENRY, Everlene Cunning on: 06/12/2017 02:03 PM   Modules accepted: Orders

## 2017-06-12 NOTE — Addendum Note (Signed)
Addended by: Renelda LomaHENRY, Ibraheem Voris on: 06/12/2017 12:41 PM   Modules accepted: Orders

## 2017-06-16 ENCOUNTER — Ambulatory Visit: Payer: BLUE CROSS/BLUE SHIELD | Admitting: Family Medicine

## 2017-07-03 ENCOUNTER — Telehealth: Payer: Self-pay | Admitting: Family Medicine

## 2017-07-03 NOTE — Telephone Encounter (Signed)
[facility]

## 2017-07-03 NOTE — Telephone Encounter (Signed)
Called pt and advised all details per Dr Susann GivensLalonde.  Also, explained that we could go ahead and refer, I asked him if he had a preference and he said no.  That is wife had used [facility] in the past, but he doesn't care where he is sent. Please refer.

## 2017-07-04 ENCOUNTER — Other Ambulatory Visit: Payer: Self-pay

## 2017-07-04 DIAGNOSIS — M79602 Pain in left arm: Secondary | ICD-10-CM

## 2017-07-10 ENCOUNTER — Encounter (INDEPENDENT_AMBULATORY_CARE_PROVIDER_SITE_OTHER): Payer: Self-pay | Admitting: Orthopaedic Surgery

## 2017-07-10 ENCOUNTER — Ambulatory Visit (INDEPENDENT_AMBULATORY_CARE_PROVIDER_SITE_OTHER): Payer: Self-pay

## 2017-07-10 ENCOUNTER — Ambulatory Visit (INDEPENDENT_AMBULATORY_CARE_PROVIDER_SITE_OTHER): Payer: BLUE CROSS/BLUE SHIELD | Admitting: Orthopaedic Surgery

## 2017-07-10 VITALS — BP 144/96 | HR 111 | Ht 73.0 in | Wt 227.0 lb

## 2017-07-10 DIAGNOSIS — M25512 Pain in left shoulder: Secondary | ICD-10-CM | POA: Diagnosis not present

## 2017-07-10 NOTE — Progress Notes (Addendum)
Office Visit Note   Patient: Marcus Holland           Date of Birth: 1966/04/15           MRN: [id number] Visit Date: 07/10/2017              Requested by: Ronnald NianLalonde, John C, MD [address] [place], [place] [id number] PCP: Ronnald NianLalonde, John C, MD   Assessment & Plan: Visit Diagnoses:  1. Acute pain of left shoulder   2. Left shoulder pain, unspecified chronicity     Plan:  #1: MRI scan of the left shoulder to rule out internal derangement.  He has failed one cortisone injection by Dr. Susann GivensLalonde and conservative treatment.  Follow-Up Instructions: Return in about 2 weeks (around 07/24/2017).   Orders:  Orders Placed This Encounter  Procedures  . XR Shoulder Left  . MR Shoulder Left w/o contrast   No orders of the defined types were placed in this encounter.     Procedures: No procedures performed   Clinical Data: No additional findings.   Subjective: Chief Complaint  Patient presents with  . Left Shoulder - Pain  . Shoulder Pain    Pt stated lt shoulder heard something pop 4 months ago    HPI  Marcus Holland is a 52 year old white male who presents today with an intermittently painful left shoulder.  He states approximately 4 months ago while in bed he took his left arm and essentially tried to put it behind his head he felt a pop.  Did not really have much pain at that time either.  However as time went on he started having problems when he goes into abduction and external rotation of the shoulder.  Denies any weakness.  He states though that what exacerbates his pain is when he tries to take and get under a table and place a plug into the socket that causes him pain.  Denies any cervical spine pain.  Denies any radicular pain.  Apparently Dr. Susann GivensLalonde did do a cortisone injection through posterior approach but did not have any benefit.  This is interfering with his work  Review of Systems  Constitutional: Negative for chills and fever.  HENT: Negative for ear pain.   Eyes:  Negative for pain.  Respiratory: Negative for cough.   Gastrointestinal: Negative for diarrhea.  Genitourinary: Negative for penile swelling.  Musculoskeletal: Negative for gait problem and neck pain.  Skin: Negative for rash and wound.  Allergic/Immunologic: Negative for food allergies.  Neurological: Negative for dizziness, numbness and headaches.  Hematological: Does not bruise/bleed easily.     Objective: Vital Signs: BP (!) 144/96   Pulse (!) 111   Ht 6\' 1"  (1.854 m)   Wt 227 lb (103 kg)   BMI 29.95 kg/m   Physical Exam  Constitutional: He is oriented to person, place, and time. He appears well-developed and well-nourished.  HENT:  Head: Normocephalic and atraumatic.  Eyes: Pupils are equal, round, and reactive to light. EOM are normal.  Pulmonary/Chest: Effort normal.  Neurological: He is alert and oriented to person, place, and time.  Skin: Skin is warm and dry.  Psychiatric: He has a normal mood and affect. His behavior is normal. Judgment and thought content normal.    Ortho Exam  Today he has range of motion of abduction to 135 degrees.  Forward flexion to 170 degrees.  External rotation to about 85 degrees with the arm at 90 degrees of abduction.  Internal rotation he can touch  his lumbar spine.  Negative empty can test.  Excellent strength throughout.  Does have a little bit of tenderness with palpation of the biceps tendon at the groove.  There is an audible click as put him through his range of motion but I cannot tell exactly where that is.  He does have some mild tenderness at the [name] joint.  Specialty Comments:  No specialty comments available.  Imaging: Xr Shoulder Left  Result Date: 07/10/2017 Three-view x-ray of the left shoulder reveals some ostial lysis at the [facility] joint.  Maintenance of the glenohumeral joint space.  Type II acromion.  [place]ony [place] proximal humerus    PMFS History: Patient Active Problem List   Diagnosis Date Noted  . Obesity (BMI  30-39.9) 08/11/2015  . Hyperlipidemia associated with type 2 diabetes mellitus (HCC) 05/03/2013  . GERD (gastroesophageal reflux disease) 05/03/2013  . DM2 (diabetes mellitus, type 2) (HCC) 01/10/2012   Past Medical History:  Diagnosis Date  . Diabetes mellitus without complication (HCC)   . Elevated lipids   . Hypertension   . Obesity   . Poor compliance     Family History  Problem Relation Age of Onset  . Cancer Father 16       lung    Past Surgical History:  Procedure Laterality Date  . CHOLECYSTECTOMY  2016  . COLONOSCOPY  2004   Dr. Elnoria Howard   Social History   Occupational History  . Not on file  Tobacco Use  . Smoking status: Never Smoker  . Smokeless tobacco: Never Used  Substance and Sexual Activity  . Alcohol use: No  . Drug use: No  . Sexual activity: Yes

## 2017-07-14 ENCOUNTER — Ambulatory Visit
Admission: RE | Admit: 2017-07-14 | Discharge: 2017-07-14 | Disposition: A | Discharge status: Home / Self Care | Payer: BLUE CROSS/BLUE SHIELD | Source: Ambulatory Visit | Attending: Orthopedic Surgery | Admitting: Orthopedic Surgery

## 2017-07-14 DIAGNOSIS — M25512 Pain in left shoulder: Secondary | ICD-10-CM

## 2017-07-14 DIAGNOSIS — M7592 Shoulder lesion, unspecified, left shoulder: Secondary | ICD-10-CM | POA: Diagnosis not present

## 2017-08-19 ENCOUNTER — Ambulatory Visit (INDEPENDENT_AMBULATORY_CARE_PROVIDER_SITE_OTHER): Payer: BLUE CROSS/BLUE SHIELD | Admitting: Orthopaedic Surgery

## 2017-08-19 ENCOUNTER — Encounter (INDEPENDENT_AMBULATORY_CARE_PROVIDER_SITE_OTHER): Payer: Self-pay | Admitting: Orthopaedic Surgery

## 2017-08-19 VITALS — BP 134/74 | HR 76 | Resp 14 | Ht 73.0 in | Wt 220.0 lb

## 2017-08-19 DIAGNOSIS — M7542 Impingement syndrome of left shoulder: Secondary | ICD-10-CM | POA: Diagnosis not present

## 2017-08-19 MED ORDER — METHYLPREDNISOLONE ACETATE 40 MG/ML IJ SUSP
80.0000 mg | INTRAMUSCULAR | Status: AC | PRN
  Administered 2017-08-19: 80 mg

## 2017-08-19 MED ORDER — LIDOCAINE HCL 2 % IJ SOLN
2.0000 mL | INTRAMUSCULAR | Status: AC | PRN
  Administered 2017-08-19: 2 mL

## 2017-08-19 MED ORDER — BUPIVACAINE HCL 0.5 % IJ SOLN
2.0000 mL | INTRAMUSCULAR | Status: AC | PRN
  Administered 2017-08-19: 2 mL via INTRA_ARTICULAR

## 2017-08-19 NOTE — Progress Notes (Signed)
Office Visit Note   Patient: Marcus Holland           Date of Birth: 1965/12/12           MRN: [id number] Visit Date: 08/19/2017              Requested by: Ronnald Nian, MD [address] [place], [place] [address] PCP: Ronnald Nian, MD   Assessment & Plan: Visit Diagnoses:  1. Impingement syndrome of left shoulder     Plan: Impingement syndrome left shoulder.  MRI scan demonstrates tendinosis of the infra and supraspinatus tendons.  There is a partial articular surface tear on the infraspinatus that looks like it is relatively small.  There was some mild bursal fluid in the subacromial area.  Also demonstrated was a posterior labral tear.  Symptoms seem to be related to impingement.  I am going to inject the subacromial space with cortisone today and send him to physical therapy.  Check back in 6 to 8 weeks.  Consider injecting the glenohumeral joint if no improvement. Less pain after the subacromial cortisone injection.  Follow-Up Instructions: Return if symptoms worsen or fail to improve.   Orders:  No orders of the defined types were placed in this encounter.  No orders of the defined types were placed in this encounter.     Procedures: Large Joint Inj: L subacromial bursa on 08/19/2017 3:43 PM Indications: pain and diagnostic evaluation Details: 25 G 1.5 in needle, anterolateral approach  Arthrogram: No  Medications: 2 mL bupivacaine 0.5 %; 2 mL lidocaine 2 %; 80 mg methylPREDNISolone acetate 40 MG/ML Consent was given by the patient. Immediately prior to procedure a time out was called to verify the correct patient, procedure, equipment, support staff and site/side marked as required. Patient was prepped and draped in the usual sterile fashion.       Clinical Data: No additional findings.   Subjective: Chief Complaint  Patient presents with  . Follow-up    MRI review  Marcus Holland returns for evaluation of the MRI scan and is left shoulder pain is previously  identified.  He has pain with certain motions in the anterior and lateral subacromial region.  Not having any posterior pain.  This or tingling.  Scan results are above  HPI  Review of Systems   Objective: Vital Signs: BP 134/74 (BP Location: Right Arm, Patient Position: Sitting, Cuff Size: Normal)   Pulse 76   Resp 14   Ht  (1.854 m)   Wt 220 lb (99.8 kg)   BMI 29.03 kg/m   Physical Exam  Constitutional: He is oriented to person, place, and time. He appears well-developed and well-nourished.  HENT:  Mouth/Throat: Oropharynx is clear and moist.  Eyes: Pupils are equal, round, and reactive to light. EOM are normal.  Pulmonary/Chest: Effort normal.  Neurological: He is alert and oriented to person, place, and time.  Skin: Skin is warm and dry.  Psychiatric: He has a normal mood and affect. His behavior is normal.    Ortho Exam awake alert and oriented x3.  Comfortable sitting.  Able to place his left arm fully overhead.  Minimal impingement.  Negative empty can testing.  With abduction external rotation he had some anterior and lateral subacromial pain.  No posterior discomfort.  No apprehension.  Skin intact.  Biceps intact.  Specialty Comments:  No specialty comments available.  Imaging: No results found.   PMFS History: Patient Active Problem List   Diagnosis Date  Noted  . Impingement syndrome of left shoulder 08/19/2017  . Obesity (BMI 30-39.9) 08/11/2015  . Hyperlipidemia associated with type 2 diabetes mellitus (HCC) 05/03/2013  . GERD (gastroesophageal reflux disease) 05/03/2013  . DM2 (diabetes mellitus, type 2) (HCC) 01/10/2012   Past Medical History:  Diagnosis Date  . Diabetes mellitus without complication (HCC)   . Elevated lipids   . Hypertension   . Obesity   . Poor compliance     Family History  Problem Relation Age of Onset  . Cancer Father 57       lung    Past Surgical History:  Procedure Laterality Date  . CHOLECYSTECTOMY  2016  .  COLONOSCOPY  2004   Dr. Elnoria Howard  . FINGER SURGERY     Social History   Occupational History  . Not on file  Tobacco Use  . Smoking status: Never Smoker  . Smokeless tobacco: Never Used  Substance and Sexual Activity  . Alcohol use: No  . Drug use: No  . Sexual activity: Yes     Valeria Batman, MD   Note - This record has been created using [employer].  Chart creation errors have been sought, but may not always  have been located. Such creation errors do not reflect on  the standard of medical care.

## 2017-08-28 DIAGNOSIS — S43432D Superior glenoid labrum lesion of left shoulder, subsequent encounter: Secondary | ICD-10-CM | POA: Diagnosis not present

## 2017-08-28 DIAGNOSIS — M25512 Pain in left shoulder: Secondary | ICD-10-CM | POA: Diagnosis not present

## 2017-08-28 DIAGNOSIS — M7542 Impingement syndrome of left shoulder: Secondary | ICD-10-CM | POA: Diagnosis not present

## 2017-09-01 DIAGNOSIS — M25512 Pain in left shoulder: Secondary | ICD-10-CM | POA: Diagnosis not present

## 2017-09-01 DIAGNOSIS — S43432D Superior glenoid labrum lesion of left shoulder, subsequent encounter: Secondary | ICD-10-CM | POA: Diagnosis not present

## 2017-09-01 DIAGNOSIS — M7542 Impingement syndrome of left shoulder: Secondary | ICD-10-CM | POA: Diagnosis not present

## 2017-09-08 DIAGNOSIS — S43432D Superior glenoid labrum lesion of left shoulder, subsequent encounter: Secondary | ICD-10-CM | POA: Diagnosis not present

## 2017-09-08 DIAGNOSIS — M25512 Pain in left shoulder: Secondary | ICD-10-CM | POA: Diagnosis not present

## 2017-09-08 DIAGNOSIS — M7542 Impingement syndrome of left shoulder: Secondary | ICD-10-CM | POA: Diagnosis not present

## 2017-09-16 DIAGNOSIS — M25512 Pain in left shoulder: Secondary | ICD-10-CM | POA: Diagnosis not present

## 2017-09-16 DIAGNOSIS — M7542 Impingement syndrome of left shoulder: Secondary | ICD-10-CM | POA: Diagnosis not present

## 2017-09-16 DIAGNOSIS — S43432D Superior glenoid labrum lesion of left shoulder, subsequent encounter: Secondary | ICD-10-CM | POA: Diagnosis not present

## 2017-09-17 ENCOUNTER — Ambulatory Visit: Payer: BLUE CROSS/BLUE SHIELD | Admitting: Family Medicine

## 2017-09-17 ENCOUNTER — Encounter: Payer: Self-pay | Admitting: Family Medicine

## 2017-09-17 VITALS — BP 128/82 | HR 85 | Temp 98.7°F | Wt 236.0 lb

## 2017-09-17 DIAGNOSIS — L03119 Cellulitis of unspecified part of limb: Secondary | ICD-10-CM

## 2017-09-17 MED ORDER — DOXYCYCLINE HYCLATE 100 MG PO TABS: 100.0000 mg | ORAL_TABLET | Freq: Two times a day (BID) | ORAL | 0 refills | Status: DC

## 2017-09-17 NOTE — Progress Notes (Signed)
   Subjective:    Patient ID: Marcus Holland, male    DOB: [birth year], 52 y.o.   MRN: [id number]  HPI Approximately 1 week ago he noted the onset of reddish lesions on the medial aspect of both ankles and what appears to be kissing lesions.  He notes no other lesions on his body.  These have grown and they are now roughly 3 cm in size.   Review of Systems     Objective:   Physical Exam Exam of both ankles shows 2 roughly 3 cm relatively round erythematous lesions that are tender to touch with slight central clearing.  Exam of the rest the skin shows no other lesions of similar character.       Assessment & Plan:  Cellulitis of lower extremity, unspecified laterality - Plan: doxycycline (VIBRA-TABS) 100 MG tablet Hard to say exactly what this is but since its growing, I marked the edge of the lesions with an ink pen.  Place him on doxycycline he will keep me informed.

## 2017-09-25 DIAGNOSIS — M7542 Impingement syndrome of left shoulder: Secondary | ICD-10-CM | POA: Diagnosis not present

## 2017-09-25 DIAGNOSIS — S43432D Superior glenoid labrum lesion of left shoulder, subsequent encounter: Secondary | ICD-10-CM | POA: Diagnosis not present

## 2017-09-25 DIAGNOSIS — M25512 Pain in left shoulder: Secondary | ICD-10-CM | POA: Diagnosis not present

## 2017-10-10 ENCOUNTER — Ambulatory Visit: Payer: BLUE CROSS/BLUE SHIELD | Admitting: Family Medicine

## 2017-10-10 ENCOUNTER — Encounter: Payer: Self-pay | Admitting: Family Medicine

## 2017-10-10 VITALS — BP 120/78 | HR 82 | Temp 98.0°F | Wt 235.2 lb

## 2017-10-10 DIAGNOSIS — E118 Type 2 diabetes mellitus with unspecified complications: Secondary | ICD-10-CM

## 2017-10-10 LAB — POCT GLYCOSYLATED HEMOGLOBIN (HGB A1C): HEMOGLOBIN A1C: 6.8 % — AB (ref 4.0–5.6)

## 2017-10-10 NOTE — Progress Notes (Signed)
  Subjective:    Patient ID: Marcus Holland, male    DOB: March 20, 1966, 52 y.o.   MRN: [id number]  Marcus Holland is a 52 y.o. male who presents for follow-up of Type 2 diabetes mellitus.  Patient {is checking home blood sugars. 150's Home blood sugar records: meter records How often is blood sugars being checked: q week Current symptoms/problems include none and have been unchanged. Daily foot checks: yes   Any foot concerns: no Last eye exam: nov 2018 Exercise: no but stays active in daily life  He continues on atorvastatin and is having no difficulty with this.  He is also taking lisinopril and amlodipine as well as Hytrin which seems to be controlling his blood pressure.  He continues on metformin and pioglitazone and is having no difficulty with that.  He states that his lifestyle in regard to eating and exercise have been constant in spite of weight gain. The following portions of the patient's history were reviewed and updated as appropriate: allergies, current medications, past medical history, past social history and problem list.  ROS as in subjective above.     Objective:    Physical Exam Alert and in no distress otherwise not examined.   Lab Review Diabetic Labs Latest Ref Rng & Units 06/12/2017 02/19/2017 02/18/2017 08/11/2015 11/21/2014  HbA1c - 6.5 - 8.7 7.0 6.9  Microalbumin mg/L - 28.6 - - -  Micro/Creat Ratio - - 13.0 - - -  Chol <200 mg/dL - - 782119 956139 -  HDL >21>40 mg/dL - - 41 30(Q39(L) -  Calc LDL mg/dL (calc) - - 60 82 -  Triglycerides <150 mg/dL - - 92 89 -  Creatinine 0.70 - 1.33 mg/dL - - 6.570.83 8.460.94 9.620.84   BP/Weight 09/17/2017 08/19/2017 07/10/2017 06/12/2017 04/23/2017  Systolic BP 128 134 144 132 124  Diastolic BP 82 74 96 88 84  Wt. (Lbs) 236 220 227 227.2 229  BMI 31.14 29.03 29.95 29.98 30.21   Foot/eye exam completion dates Latest Ref Rng & Units 03/28/2017 02/24/2017  Eye Exam No Retinopathy No Retinopathy No Retinopathy  Foot Form Completion - - -   A1c is 6.8 [name]   reports that he has never smoked. He has never used smokeless tobacco. He reports that he does not drink alcohol or use drugs.     Assessment & Plan:    Type 2 diabetes mellitus with complication, without long-term current use of insulin (HCC) - Plan: POCT glycosylated hemoglobin (Hb A1C) 1.  2. Rx changes: none 3. Education: Reviewed 'ABCs' of diabetes management (respective goals in parentheses):  A1C (<7), blood pressure (<130/80), and cholesterol (LDL <100). 4. Compliance at present is estimated to be fair. Efforts to improve compliance (if necessary) will be directed at increased exercise. 5. Follow up: 4 months I also instructed him to look at his dietary intake and not just physical activity as obviously something is happening to cause him to gain weight.  Explained this also in terms of the fact that his A1c is actually increased.

## 2017-12-17 ENCOUNTER — Telehealth (INDEPENDENT_AMBULATORY_CARE_PROVIDER_SITE_OTHER): Payer: Self-pay | Admitting: *Deleted

## 2017-12-17 NOTE — Telephone Encounter (Signed)
I received a fax from pt insurance [employer] thru [employer] stating that pt mri that was done on 07/14/17 was not approved because was not medically necessary and if wanted to do appeal to do so.  I filled out the appeal form explained reason needed for the MRI left shoulder and also explained to them that when I called to check to see if needed PA I was told it was not and gave ref # [id number] [phone]. And informed that it was medically necessary. Pt had already gotten the mri done.   I faxed the form along with additional information, notification letter and complete medical record for the dates for services to be appealed.

## 2017-12-26 ENCOUNTER — Telehealth (INDEPENDENT_AMBULATORY_CARE_PROVIDER_SITE_OTHER): Payer: Self-pay | Admitting: *Deleted

## 2017-12-26 NOTE — Telephone Encounter (Signed)
Received fax from [facility] advising that the appeal that was done recently on 08/28 for procedure 73221 has been approved with authorization #[id number] and is valid until 08/28/17.

## 2018-02-04 ENCOUNTER — Other Ambulatory Visit: Payer: Self-pay | Admitting: Family Medicine

## 2018-03-06 ENCOUNTER — Other Ambulatory Visit: Payer: Self-pay | Admitting: Family Medicine

## 2018-03-06 DIAGNOSIS — I152 Hypertension secondary to endocrine disorders: Secondary | ICD-10-CM

## 2018-03-06 DIAGNOSIS — E1159 Type 2 diabetes mellitus with other circulatory complications: Secondary | ICD-10-CM

## 2018-03-06 DIAGNOSIS — I10 Essential (primary) hypertension: Principal | ICD-10-CM

## 2018-03-16 ENCOUNTER — Other Ambulatory Visit: Payer: Self-pay | Admitting: Family Medicine

## 2018-03-16 DIAGNOSIS — I152 Hypertension secondary to endocrine disorders: Secondary | ICD-10-CM

## 2018-03-16 DIAGNOSIS — E1159 Type 2 diabetes mellitus with other circulatory complications: Secondary | ICD-10-CM

## 2018-03-16 DIAGNOSIS — I10 Essential (primary) hypertension: Principal | ICD-10-CM

## 2018-03-17 DIAGNOSIS — E119 Type 2 diabetes mellitus without complications: Secondary | ICD-10-CM | POA: Diagnosis not present

## 2018-03-17 LAB — HEMOGLOBIN A1C: Hemoglobin A1C: 6.9 % — AB (ref 4.0–5.6)

## 2018-03-17 LAB — HM DIABETES EYE EXAM

## 2018-03-25 ENCOUNTER — Ambulatory Visit: Payer: BLUE CROSS/BLUE SHIELD | Admitting: Family Medicine

## 2018-03-25 ENCOUNTER — Encounter: Payer: Self-pay | Admitting: Family Medicine

## 2018-03-25 VITALS — BP 118/76 | HR 78 | Temp 98.1°F | Wt 236.4 lb

## 2018-03-25 DIAGNOSIS — E785 Hyperlipidemia, unspecified: Secondary | ICD-10-CM

## 2018-03-25 DIAGNOSIS — E1159 Type 2 diabetes mellitus with other circulatory complications: Secondary | ICD-10-CM

## 2018-03-25 DIAGNOSIS — I10 Essential (primary) hypertension: Secondary | ICD-10-CM

## 2018-03-25 DIAGNOSIS — I152 Hypertension secondary to endocrine disorders: Secondary | ICD-10-CM

## 2018-03-25 DIAGNOSIS — E119 Type 2 diabetes mellitus without complications: Secondary | ICD-10-CM | POA: Diagnosis not present

## 2018-03-25 DIAGNOSIS — E669 Obesity, unspecified: Secondary | ICD-10-CM

## 2018-03-25 DIAGNOSIS — E1169 Type 2 diabetes mellitus with other specified complication: Secondary | ICD-10-CM | POA: Diagnosis not present

## 2018-03-25 DIAGNOSIS — L723 Sebaceous cyst: Secondary | ICD-10-CM

## 2018-03-25 LAB — POCT GLYCOSYLATED HEMOGLOBIN (HGB A1C): HEMOGLOBIN A1C: 6.7 % — AB (ref 4.0–5.6)

## 2018-03-25 MED ORDER — METFORMIN HCL 1000 MG PO TABS: 1000.0000 mg | ORAL_TABLET | Freq: Two times a day (BID) | ORAL | 1 refills | Status: DC

## 2018-03-25 MED ORDER — TERAZOSIN HCL 1 MG PO CAPS: 1.0000 mg | ORAL_CAPSULE | Freq: Every day | ORAL | 3 refills | Status: DC

## 2018-03-25 MED ORDER — LIDOCAINE-EPINEPHRINE 2 %-1:100000 IJ SOLN
1.7000 mL | Freq: Once | INTRAMUSCULAR | Status: AC
  Administered 2018-03-25: 1.7 mL via INTRADERMAL

## 2018-03-25 MED ORDER — PIOGLITAZONE HCL 30 MG PO TABS: 30.0000 mg | ORAL_TABLET | Freq: Every day | ORAL | 1 refills | Status: DC

## 2018-03-25 MED ORDER — LISINOPRIL-HYDROCHLOROTHIAZIDE 20-12.5 MG PO TABS: 1.0000 | ORAL_TABLET | Freq: Every day | ORAL | 3 refills | Status: DC

## 2018-03-25 NOTE — Addendum Note (Signed)
Addended by: Renelda LomaHENRY, [place] Hudman on: 03/25/2018 01:20 PM   Modules accepted: Orders

## 2018-03-25 NOTE — Progress Notes (Signed)
Subjective:    Patient ID: Marcus Holland, male    DOB: 17-Feb-1966, 52 y.o.   MRN: [id number]  Marcus Holland is a 52 y.o. male who presents for follow-up of Type 2 diabetes mellitus.  Home blood sugar records: fasting range: Low 100s Current symptoms/problems include none and have been unchanged. Daily foot checks:   Any foot concerns: none Exercise: The patient does not participate in regular exercise at present. Diet: Regular He continues on amlodipine, lisinopril and Hytrin for his blood pressure and is getting good results with that.  He continues on metformin twice per day.  Is also taking atorvastatin and having no difficulty with that.  Work and home life are going quite well.  He does have a lesion on his mid back that is bothering him.  Apparently a foreign penetrated this lesion causing it to drain slightly of a whitish material.  He has had previous sebaceous cyst removed from that area. The following portions of the patient's history were reviewed and updated as appropriate: allergies, current medications, past medical history, past social history and problem list.  ROS as in subjective above.     Objective:    Physical Exam Alert and in no distress foot exam is recorded and is normal.  Exam of his back shows a 3 cm fluctuant nontender nonerythematous lesion in the mid upper back area. Blood pressure 118/76, pulse 78, temperature 98.1 F (36.7 C), weight 236 lb 6.4 oz (107.2 kg), SpO2 99 %.  Lab Review Diabetic Labs Latest Ref Rng & Units 10/10/2017 06/12/2017 02/19/2017 02/18/2017 08/11/2015  HbA1c 4.0 - 5.6 % 6.8(A) 6.5 - 8.7 7.0  Microalbumin mg/L - - 28.6 - -  Micro/Creat Ratio - - - 13.0 - -  Chol <200 mg/dL - - - 621119 308139  HDL >65>40 mg/dL - - - 41 78(I39(L)  Calc LDL mg/dL (calc) - - - 60 82  Triglycerides <150 mg/dL - - - 92 89  Creatinine 0.70 - 1.33 mg/dL - - - 6.960.83 2.950.94   BP/Weight 03/25/2018 10/10/2017 09/17/2017 08/19/2017 07/10/2017  Systolic BP 118 120 128 134 144  Diastolic BP  76 78 82 74 96  Wt. (Lbs) 236.4 235.2 236 220 227  BMI 31.19 31.03 31.14 29.03 29.95   Foot/eye exam completion dates Latest Ref Rng & Units 03/28/2017 02/24/2017  Eye Exam No Retinopathy No Retinopathy No Retinopathy  Foot Form Completion - - -  A1c is 6.7  Tyhir  reports that he has never smoked. He has never used smokeless tobacco. He reports that he does not drink alcohol or use drugs.     Assessment & Plan:    Type 2 diabetes mellitus without complication, without long-term current use of insulin (HCC) - Plan: CBC with Differential/Platelet, Comprehensive metabolic panel, Lipid panel, POCT UA - Microalbumin  Hyperlipidemia associated with type 2 diabetes mellitus (HCC) - Plan: Lipid panel  Obesity (BMI 30-39.9) - Plan: CBC with Differential/Platelet, Comprehensive metabolic panel, Lipid panel  Hypertension associated with diabetes (HCC) - Plan: CBC with Differential/Platelet, Comprehensive metabolic panel  Sebaceous cyst    1. Rx changes: none 2. Education: Reviewed 'ABCs' of diabetes management (respective goals in parentheses):  A1C (<7), blood pressure (<130/80), and cholesterol (LDL <100). 3. Compliance at present is estimated to be good. Efforts to improve compliance (if necessary) will be directed at increased exercise. 4. Follow up: 4 months The lesion was injected with Xylocaine and a 2 cm incision was made.  The cyst material and  sac was then manually excised.  No evidence of infection was noted.  It was packed with iodoform.  He will return here in 2 days for packing removal.  Continue on all of his other present medications.

## 2018-03-26 LAB — COMPREHENSIVE METABOLIC PANEL
A/G RATIO: 2.9 — AB (ref 1.2–2.2)
ALK PHOS: 113 IU/L (ref 39–117)
ALT: 24 IU/L (ref 0–44)
AST: 17 IU/L (ref 0–40)
Albumin: 5 g/dL (ref 3.5–5.5)
BUN / CREAT RATIO: 16 (ref 9–20)
BUN: 15 mg/dL (ref 6–24)
Bilirubin Total: 0.6 mg/dL (ref 0.0–1.2)
CALCIUM: 10 mg/dL (ref 8.7–10.2)
CHLORIDE: 99 mmol/L (ref 96–106)
CO2: 26 mmol/L (ref 20–29)
Creatinine, Ser: 0.92 mg/dL (ref 0.76–1.27)
GFR calc non Af Amer: 94 mL/min/{1.73_m2} (ref >59)
GFR, EST AFRICAN AMERICAN: 109 mL/min/{1.73_m2} (ref >59)
GLUCOSE: 126 mg/dL — AB (ref 65–99)
Globulin, Total: 1.7 g/dL (ref 1.5–4.5)
Potassium: 3.6 mmol/L (ref 3.5–5.2)
Sodium: 144 mmol/L (ref 134–144)
TOTAL PROTEIN: 6.7 g/dL (ref 6.0–8.5)

## 2018-03-26 LAB — CBC WITH DIFFERENTIAL/PLATELET
BASOS: 1 %
Basophils Absolute: 0 10*3/uL (ref 0.0–0.2)
EOS (ABSOLUTE): 0.1 10*3/uL (ref 0.0–0.4)
Eos: 2 %
Hematocrit: 40 % (ref 37.5–51.0)
Hemoglobin: 13.6 g/dL (ref 13.0–17.7)
IMMATURE GRANULOCYTES: 0 %
Immature Grans (Abs): 0 10*3/uL (ref 0.0–0.1)
LYMPHS: 27 %
Lymphocytes Absolute: 1.7 10*3/uL (ref 0.7–3.1)
MCH: 29.8 pg (ref 26.6–33.0)
MCHC: 34 g/dL (ref 31.5–35.7)
MCV: 88 fL (ref 79–97)
MONOS ABS: 0.5 10*3/uL (ref 0.1–0.9)
Monocytes: 7 %
NEUTROS PCT: 63 %
Neutrophils Absolute: 3.9 10*3/uL (ref 1.4–7.0)
PLATELETS: 266 10*3/uL (ref 150–450)
RBC: 4.56 x10E6/uL (ref 4.14–5.80)
RDW: 12.8 % (ref 12.3–15.4)
WBC: 6.3 10*3/uL (ref 3.4–10.8)

## 2018-03-26 LAB — LIPID PANEL
CHOLESTEROL TOTAL: 118 mg/dL (ref 100–199)
Chol/HDL Ratio: 3 ratio (ref 0.0–5.0)
HDL: 40 mg/dL (ref >39)
LDL Calculated: 58 mg/dL (ref 0–99)
Triglycerides: 98 mg/dL (ref 0–149)
VLDL CHOLESTEROL CAL: 20 mg/dL (ref 5–40)

## 2018-03-27 ENCOUNTER — Ambulatory Visit (INDEPENDENT_AMBULATORY_CARE_PROVIDER_SITE_OTHER): Payer: BLUE CROSS/BLUE SHIELD | Admitting: Medical

## 2018-03-27 VITALS — BP 124/80 | HR 62 | Temp 97.8°F | Resp 16 | Ht 73.0 in | Wt 235.0 lb

## 2018-03-27 DIAGNOSIS — Z5189 Encounter for other specified aftercare: Secondary | ICD-10-CM

## 2018-03-27 NOTE — Progress Notes (Signed)
Subjective:  Marcus Holland is a 52 y.o. male who presents for Chief Complaint  Patient presents with  . follow up    follow up remove packing      Here for packing removal from sebaceous cyst excision 3 days ago.  Doing fine, no c/o, no fever, no pain.    No other c/o.  ROS Otherwise as in subjective above   Objective: BP 124/80   Pulse 62   Temp 97.8 F (36.6 C) (Oral)   Resp 16   Ht 6\' 1"  (1.854 m)   Wt 235 lb (106.6 kg)   SpO2 97%   BMI 31.00 kg/m   General appearance: alert, no distress, well developed, well nourished Skin: mid upper back with 1.2 cm linear wound with packing in place, no significant induration, fluctuance, warmth or erythema   Assessment: Encounter Diagnosis  Name Primary?  . Visit for wound check Yes     Plan: Clean and prepped wound of mid upper back, removed packing.  Wound is healing appropriately.  I placed additional 1/4 inch iodoform packing into the wound.  Covered with sterile bandage.  Discussed wound care.  He will have his wife pull out the packing in 2 days on Sunday.  After that he can shower as normal and watch for any signs of infection that would prompt a recheck.  Follow-up PRN

## 2018-04-02 ENCOUNTER — Telehealth: Payer: Self-pay

## 2018-04-02 ENCOUNTER — Other Ambulatory Visit: Payer: Self-pay | Admitting: Family Medicine

## 2018-04-02 DIAGNOSIS — I152 Hypertension secondary to endocrine disorders: Secondary | ICD-10-CM

## 2018-04-02 DIAGNOSIS — I10 Essential (primary) hypertension: Secondary | ICD-10-CM

## 2018-04-02 DIAGNOSIS — E119 Type 2 diabetes mellitus without complications: Secondary | ICD-10-CM

## 2018-04-02 DIAGNOSIS — E785 Hyperlipidemia, unspecified: Principal | ICD-10-CM

## 2018-04-02 DIAGNOSIS — E1169 Type 2 diabetes mellitus with other specified complication: Secondary | ICD-10-CM

## 2018-04-02 DIAGNOSIS — E1159 Type 2 diabetes mellitus with other circulatory complications: Secondary | ICD-10-CM

## 2018-04-02 MED ORDER — METFORMIN HCL 1000 MG PO TABS: 1000.0000 mg | ORAL_TABLET | Freq: Two times a day (BID) | ORAL | 1 refills | Status: DC

## 2018-04-02 NOTE — Telephone Encounter (Signed)
Patient called to inform that Metformin was sent to the wrong pharmacy. Resending to [facility]

## 2018-04-03 ENCOUNTER — Other Ambulatory Visit: Payer: Self-pay

## 2018-04-03 DIAGNOSIS — E1169 Type 2 diabetes mellitus with other specified complication: Secondary | ICD-10-CM

## 2018-04-03 DIAGNOSIS — E785 Hyperlipidemia, unspecified: Principal | ICD-10-CM

## 2018-04-03 MED ORDER — ATORVASTATIN CALCIUM 20 MG PO TABS: 20.0000 mg | ORAL_TABLET | Freq: Every day | ORAL | 4 refills | Status: DC

## 2018-04-17 ENCOUNTER — Other Ambulatory Visit: Payer: Self-pay | Admitting: Family Medicine

## 2018-04-17 DIAGNOSIS — I10 Essential (primary) hypertension: Principal | ICD-10-CM

## 2018-04-17 DIAGNOSIS — I152 Hypertension secondary to endocrine disorders: Secondary | ICD-10-CM

## 2018-04-17 DIAGNOSIS — E1159 Type 2 diabetes mellitus with other circulatory complications: Secondary | ICD-10-CM

## 2018-06-02 ENCOUNTER — Telehealth: Payer: Self-pay

## 2018-06-02 NOTE — Telephone Encounter (Signed)
Pt called and was upset and advising office of meds being sent to the wrong pharmacy. Called back no answer and LVM. KH

## 2018-06-30 ENCOUNTER — Encounter: Payer: Self-pay | Admitting: Family Medicine

## 2018-06-30 ENCOUNTER — Other Ambulatory Visit: Payer: Self-pay

## 2018-06-30 DIAGNOSIS — E1159 Type 2 diabetes mellitus with other circulatory complications: Secondary | ICD-10-CM

## 2018-06-30 DIAGNOSIS — E1169 Type 2 diabetes mellitus with other specified complication: Secondary | ICD-10-CM

## 2018-06-30 DIAGNOSIS — I152 Hypertension secondary to endocrine disorders: Secondary | ICD-10-CM

## 2018-06-30 DIAGNOSIS — E785 Hyperlipidemia, unspecified: Secondary | ICD-10-CM

## 2018-06-30 DIAGNOSIS — E119 Type 2 diabetes mellitus without complications: Secondary | ICD-10-CM

## 2018-06-30 DIAGNOSIS — I10 Essential (primary) hypertension: Principal | ICD-10-CM

## 2018-06-30 MED ORDER — AMLODIPINE BESYLATE 10 MG PO TABS: 10.0000 mg | ORAL_TABLET | Freq: Every day | ORAL | 4 refills | Status: DC

## 2018-06-30 MED ORDER — ATORVASTATIN CALCIUM 20 MG PO TABS: 20.0000 mg | ORAL_TABLET | Freq: Every day | ORAL | 4 refills | Status: DC

## 2018-06-30 MED ORDER — TERAZOSIN HCL 1 MG PO CAPS: 1.0000 mg | ORAL_CAPSULE | Freq: Every day | ORAL | 3 refills | Status: DC

## 2018-06-30 MED ORDER — METFORMIN HCL 1000 MG PO TABS: 1000.0000 mg | ORAL_TABLET | Freq: Two times a day (BID) | ORAL | 1 refills | Status: DC

## 2018-11-12 ENCOUNTER — Encounter: Payer: Self-pay | Admitting: Family Medicine

## 2018-11-12 ENCOUNTER — Other Ambulatory Visit: Payer: Self-pay

## 2018-11-12 DIAGNOSIS — E119 Type 2 diabetes mellitus without complications: Secondary | ICD-10-CM

## 2018-11-12 MED ORDER — PIOGLITAZONE HCL 30 MG PO TABS: 30.0000 mg | ORAL_TABLET | Freq: Every day | ORAL | 1 refills | Status: DC

## 2018-12-17 ENCOUNTER — Other Ambulatory Visit: Payer: Self-pay | Admitting: Family Medicine

## 2018-12-17 DIAGNOSIS — I152 Hypertension secondary to endocrine disorders: Secondary | ICD-10-CM

## 2018-12-17 DIAGNOSIS — E1159 Type 2 diabetes mellitus with other circulatory complications: Secondary | ICD-10-CM

## 2019-05-04 ENCOUNTER — Ambulatory Visit: Payer: BC Managed Care – PPO | Admitting: Family Medicine

## 2019-05-04 ENCOUNTER — Other Ambulatory Visit: Payer: Self-pay

## 2019-05-04 VITALS — BP 120/82 | HR 83 | Temp 98.2°F | Wt 245.4 lb

## 2019-05-04 DIAGNOSIS — Z79899 Other long term (current) drug therapy: Secondary | ICD-10-CM

## 2019-05-04 DIAGNOSIS — E1169 Type 2 diabetes mellitus with other specified complication: Secondary | ICD-10-CM

## 2019-05-04 DIAGNOSIS — E119 Type 2 diabetes mellitus without complications: Secondary | ICD-10-CM | POA: Diagnosis not present

## 2019-05-04 DIAGNOSIS — E1159 Type 2 diabetes mellitus with other circulatory complications: Secondary | ICD-10-CM

## 2019-05-04 DIAGNOSIS — I10 Essential (primary) hypertension: Secondary | ICD-10-CM | POA: Diagnosis not present

## 2019-05-04 DIAGNOSIS — E785 Hyperlipidemia, unspecified: Secondary | ICD-10-CM

## 2019-05-04 DIAGNOSIS — H9203 Otalgia, bilateral: Secondary | ICD-10-CM | POA: Diagnosis not present

## 2019-05-04 DIAGNOSIS — I152 Hypertension secondary to endocrine disorders: Secondary | ICD-10-CM

## 2019-05-04 LAB — POCT GLYCOSYLATED HEMOGLOBIN (HGB A1C): Hemoglobin A1C: 7.6 % — AB (ref 4.0–5.6)

## 2019-05-04 MED ORDER — AMLODIPINE BESYLATE 10 MG PO TABS: 10.0000 mg | ORAL_TABLET | Freq: Every day | ORAL | 3 refills | Status: DC

## 2019-05-04 MED ORDER — LISINOPRIL-HYDROCHLOROTHIAZIDE 20-12.5 MG PO TABS: 1.0000 | ORAL_TABLET | Freq: Every day | ORAL | 3 refills | Status: DC

## 2019-05-04 MED ORDER — ATORVASTATIN CALCIUM 20 MG PO TABS: 20.0000 mg | ORAL_TABLET | Freq: Every day | ORAL | 3 refills | Status: DC

## 2019-05-04 MED ORDER — PIOGLITAZONE HCL 30 MG PO TABS: 30.0000 mg | ORAL_TABLET | Freq: Every day | ORAL | 1 refills | Status: DC

## 2019-05-04 MED ORDER — TERAZOSIN HCL 1 MG PO CAPS: 1.0000 mg | ORAL_CAPSULE | Freq: Every day | ORAL | 3 refills | Status: DC

## 2019-05-04 MED ORDER — METFORMIN HCL 1000 MG PO TABS: 1000.0000 mg | ORAL_TABLET | Freq: Two times a day (BID) | ORAL | 1 refills | Status: DC

## 2019-05-04 NOTE — Progress Notes (Signed)
   Subjective:    Patient ID: Marcus Holland, male    DOB: 1966/01/24, 54 y.o.   MRN: [id number]  HPI He complains of a several day history of bilateral earache and occasional episodes of dizziness but no sore throat, cough, fever, chills.  He does travel a lot and has been in cold environments over the last week or so.  Also he has not been seen for his general medical care including diabetes due to insurance issues in over a year.  He has continued on his medications and has had no difficulty with them.  He now has different insurance and would like to get hooked back into the medical system   Review of Systems     Objective:   Physical Exam Alert and in no distress.  TMs and canals are normal.  Neck is supple without adenopathy.  Throat is clear. Hemoglobin A1c is 7.6     Assessment & Plan:  Otalgia of both ears  Type 2 diabetes mellitus without complication, without long-term current use of insulin (HCC) - Plan: CBC with Differential, Comprehensive metabolic panel, Lipid panel, POCT glycosylated hemoglobin (Hb A1C), CANCELED: POCT UA - Microalbumin  Hypertension associated with diabetes (HCC) - Plan: CBC with Differential, Comprehensive metabolic panel  Hyperlipidemia associated with type 2 diabetes mellitus (HCC) - Plan: Lipid panel  Encounter for long-term (current) use of medications - Plan: CBC with Differential, Comprehensive metabolic panel, Lipid panel, POCT glycosylated hemoglobin (Hb A1C), CANCELED: POCT UA - Microalbumin I explained that I saw nothing of any concern concerning his ear pain. I will do routine blood work, renew his medications and have him come back here for complete examination.

## 2019-05-05 LAB — LIPID PANEL
Chol/HDL Ratio: 3.3 ratio (ref 0.0–5.0)
Cholesterol, Total: 126 mg/dL (ref 100–199)
HDL: 38 mg/dL — ABNORMAL LOW (ref >39)
LDL Chol Calc (NIH): 69 mg/dL (ref 0–99)
Triglycerides: 99 mg/dL (ref 0–149)
VLDL Cholesterol Cal: 19 mg/dL (ref 5–40)

## 2019-05-05 LAB — COMPREHENSIVE METABOLIC PANEL
ALT: 25 IU/L (ref 0–44)
AST: 16 IU/L (ref 0–40)
Albumin/Globulin Ratio: 2.4 — ABNORMAL HIGH (ref 1.2–2.2)
Albumin: 4.5 g/dL (ref 3.8–4.9)
Alkaline Phosphatase: 95 IU/L (ref 39–117)
BUN/Creatinine Ratio: 18 (ref 9–20)
BUN: 15 mg/dL (ref 6–24)
Bilirubin Total: 0.3 mg/dL (ref 0.0–1.2)
CO2: 24 mmol/L (ref 20–29)
Calcium: 9.4 mg/dL (ref 8.7–10.2)
Chloride: 101 mmol/L (ref 96–106)
Creatinine, Ser: 0.83 mg/dL (ref 0.76–1.27)
GFR calc Af Amer: 115 mL/min/{1.73_m2} (ref >59)
GFR calc non Af Amer: 99 mL/min/{1.73_m2} (ref >59)
Globulin, Total: 1.9 g/dL (ref 1.5–4.5)
Glucose: 151 mg/dL — ABNORMAL HIGH (ref 65–99)
Potassium: 3.9 mmol/L (ref 3.5–5.2)
Sodium: 141 mmol/L (ref 134–144)
Total Protein: 6.4 g/dL (ref 6.0–8.5)

## 2019-05-05 LAB — CBC WITH DIFFERENTIAL/PLATELET
Basophils Absolute: 0 10*3/uL (ref 0.0–0.2)
Basos: 1 %
EOS (ABSOLUTE): 0.2 10*3/uL (ref 0.0–0.4)
Eos: 3 %
Hematocrit: 40.4 % (ref 37.5–51.0)
Hemoglobin: 13.8 g/dL (ref 13.0–17.7)
Immature Grans (Abs): 0 10*3/uL (ref 0.0–0.1)
Immature Granulocytes: 0 %
Lymphocytes Absolute: 1.8 10*3/uL (ref 0.7–3.1)
Lymphs: 30 %
MCH: 30.1 pg (ref 26.6–33.0)
MCHC: 34.2 g/dL (ref 31.5–35.7)
MCV: 88 fL (ref 79–97)
Monocytes Absolute: 0.4 10*3/uL (ref 0.1–0.9)
Monocytes: 7 %
Neutrophils Absolute: 3.6 10*3/uL (ref 1.4–7.0)
Neutrophils: 59 %
Platelets: 281 10*3/uL (ref 150–450)
RBC: 4.59 x10E6/uL (ref 4.14–5.80)
RDW: 12.9 % (ref 11.6–15.4)
WBC: 6 10*3/uL (ref 3.4–10.8)

## 2019-07-13 ENCOUNTER — Other Ambulatory Visit: Payer: Self-pay

## 2019-07-13 ENCOUNTER — Ambulatory Visit: Payer: BC Managed Care – PPO | Admitting: Family Medicine

## 2019-07-13 ENCOUNTER — Encounter: Payer: Self-pay | Admitting: Family Medicine

## 2019-07-13 VITALS — BP 134/80 | HR 70 | Temp 97.5°F | Ht 73.0 in | Wt 249.2 lb

## 2019-07-13 DIAGNOSIS — E785 Hyperlipidemia, unspecified: Secondary | ICD-10-CM

## 2019-07-13 DIAGNOSIS — E669 Obesity, unspecified: Secondary | ICD-10-CM

## 2019-07-13 DIAGNOSIS — I10 Essential (primary) hypertension: Secondary | ICD-10-CM

## 2019-07-13 DIAGNOSIS — Z0000 Encounter for general adult medical examination without abnormal findings: Secondary | ICD-10-CM

## 2019-07-13 DIAGNOSIS — Z23 Encounter for immunization: Secondary | ICD-10-CM

## 2019-07-13 DIAGNOSIS — E1159 Type 2 diabetes mellitus with other circulatory complications: Secondary | ICD-10-CM

## 2019-07-13 DIAGNOSIS — I152 Hypertension secondary to endocrine disorders: Secondary | ICD-10-CM

## 2019-07-13 DIAGNOSIS — Z79899 Other long term (current) drug therapy: Secondary | ICD-10-CM

## 2019-07-13 DIAGNOSIS — E119 Type 2 diabetes mellitus without complications: Secondary | ICD-10-CM

## 2019-07-13 DIAGNOSIS — E1169 Type 2 diabetes mellitus with other specified complication: Secondary | ICD-10-CM

## 2019-07-13 NOTE — Progress Notes (Signed)
   Subjective:    Patient ID: Marcus Holland, male    DOB: 10/07/1965, 54 y.o.   MRN: [id number]  HPI He is here for complete examination.  He does have underlying diabetes.  He did have blood work done in January is also an A1c which was 7.6.  He continues on Metformin and Actos and having no difficulty with that.  He is also taking lisinopril, amlodipine and terazosin for his blood pressure without swelling, coughing or edema.  Atorvastatin is causing no trouble with muscle aches or pains.  He does check his feet regularly and is in need of an eye exam.  Work does get in the way of making this.  Family and social history as well as health maintenance and immunizations was reviewed. His work and home life are going quite well.  His work keeps him from exercising with any regularity.  Review of Systems  All other systems reviewed and are negative.      Objective:   Physical Exam Alert and in no distress. Tympanic membranes and canals are normal. Pharyngeal area is normal. Neck is supple without adenopathy or thyromegaly. Cardiac exam shows a regular sinus rhythm without murmurs or gallops. Lungs are clear to auscultation.        Assessment & Plan:  Need for vaccination against Streptococcus pneumoniae - Plan: Pneumococcal polysaccharide vaccine 23-valent greater than or equal to 2yo subcutaneous/IM, CANCELED: Pneumococcal polysaccharide vaccine 23-valent greater than or equal to 2yo subcutaneous/IM  Type 2 diabetes mellitus without complication, without long-term current use of insulin (HCC)  Hypertension associated with diabetes (HCC)  Hyperlipidemia associated with type 2 diabetes mellitus (HCC)  Encounter for long-term (current) use of medications  Obesity (BMI 30-39.9) Discussed diet and exercise as well as staying on his present medication regimen. His immunizations were updated. Recheck here in several months.

## 2019-09-11 IMAGING — MR MR SHOULDER*L* W/O CM
4 of 5 series · 29 of 40 positions shown · non-contrast
Comparison: None.

CLINICAL DATA: Lt shoulder pain with increased pain when raising
arm above head since mid March 2017.

EXAM:
MRI OF THE LEFT SHOULDER WITHOUT CONTRAST
TECHNIQUE: Multiplanar, multisequence MR imaging of the shoulder was performed.
No intravenous contrast was administered.

[Series 3: T2 fat-sat · axial · 4.0mm · 0.25mm/px · z∈[-64,+23]mm · 8 of 20 slices shown (1 of 3)]
[im 1/20]
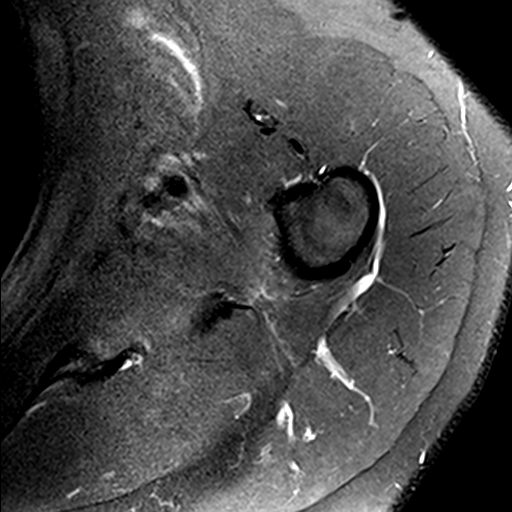
[im 3/20]
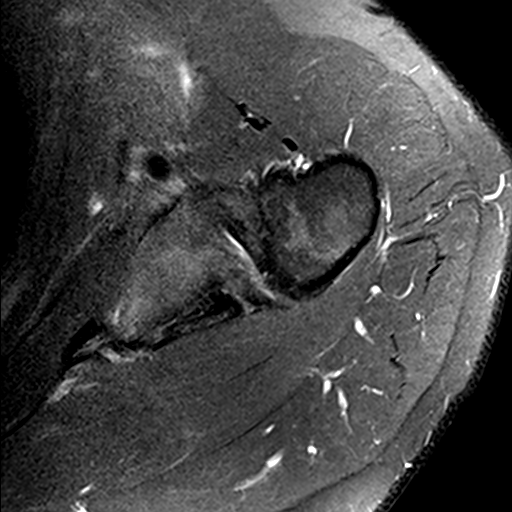
[im 6/20]
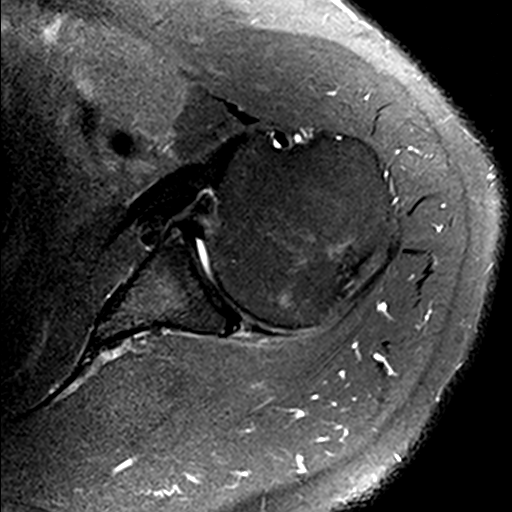
[im 9/20]
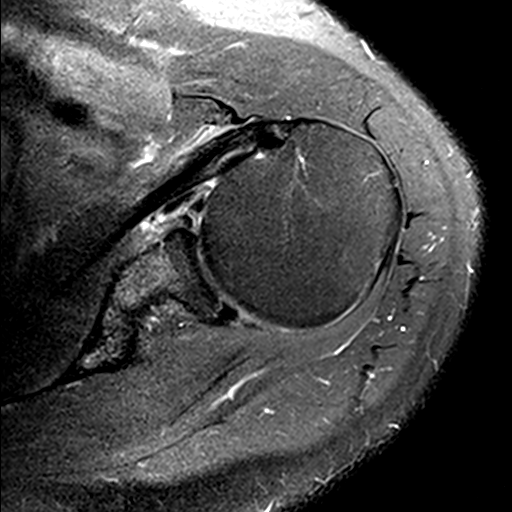
[im 11/20]
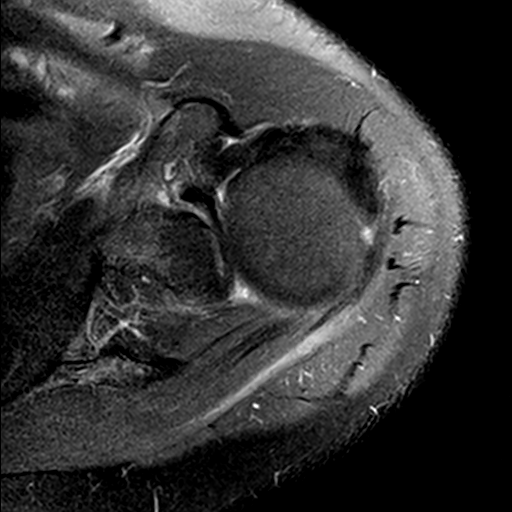
[im 14/20]
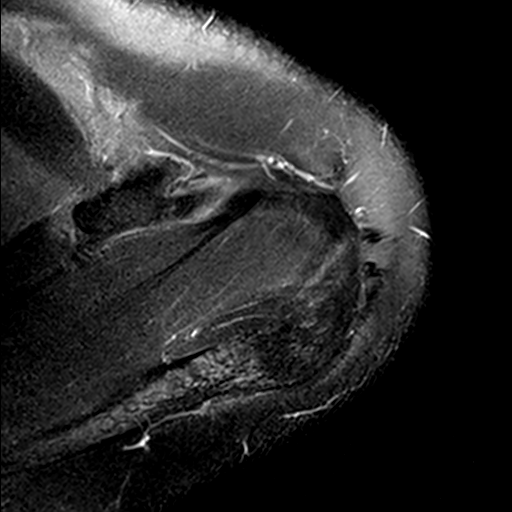
[im 17/20]
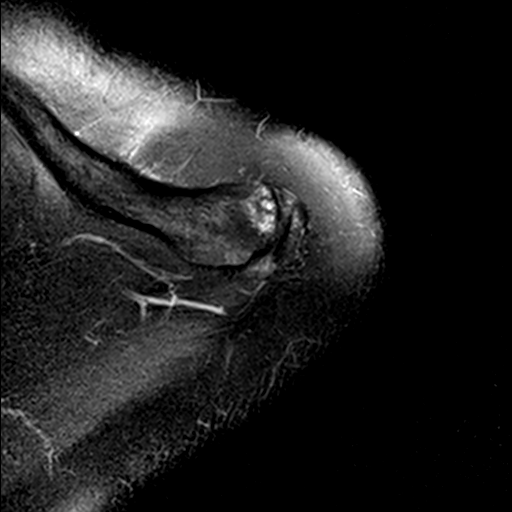
[im 20/20]
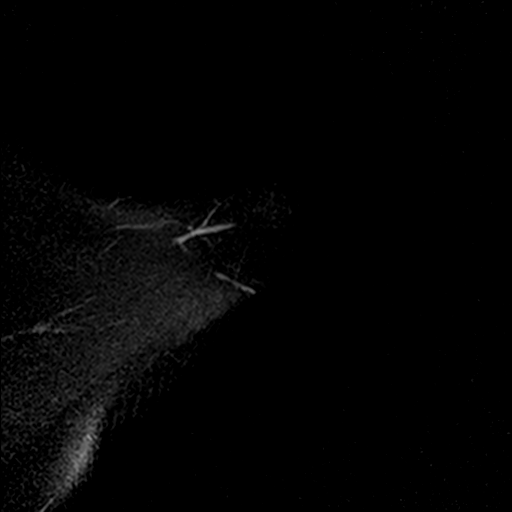

[Series 4: T2 fat-sat · sagittal · 4.0mm · 0.55mm/px · 9 of 20 slices shown (2 of 3)]
[im 1/20]
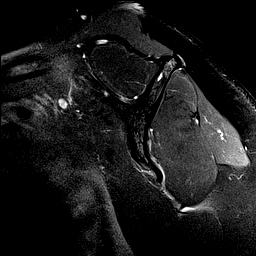
[im 3/20]
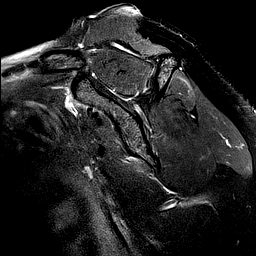
[im 5/20]
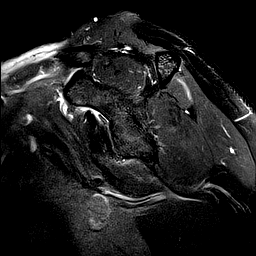
[im 8/20]
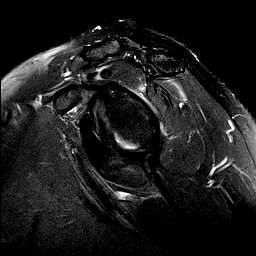
[im 10/20]
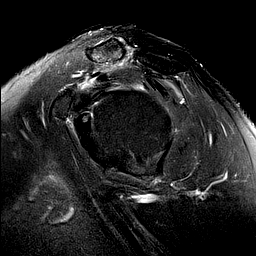
[im 12/20]
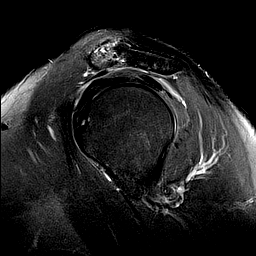
[im 15/20]
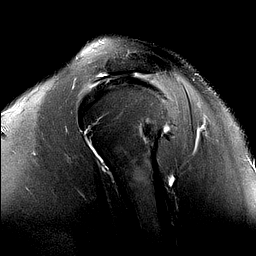
[im 17/20]
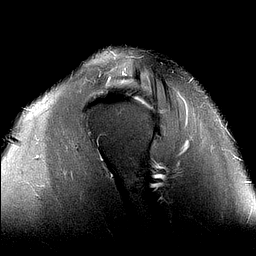
[im 20/20]
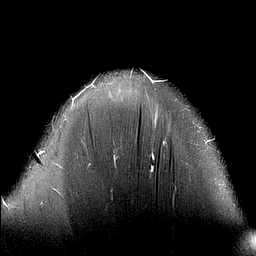

[Series 6: T2 fat-sat · coronal · 4.0mm · 0.55mm/px · 5 of 15 slices shown (3 of 3)]
[im 1/15]
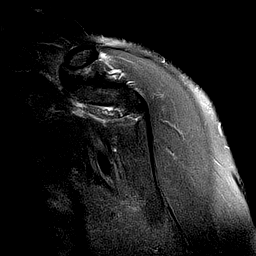
[im 3/15]
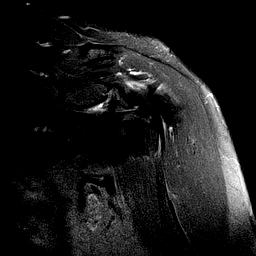
[im 5/15]
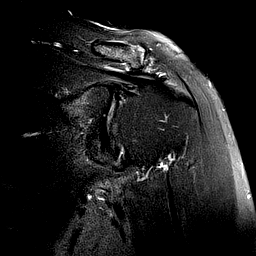
[im 8/15]
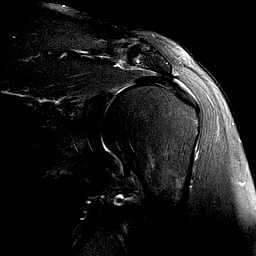
[im 12/15]
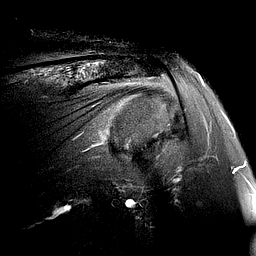

[Series 7: PD · coronal · 4.0mm · 0.27mm/px · 7 of 15 slices shown]
[im 1/15]
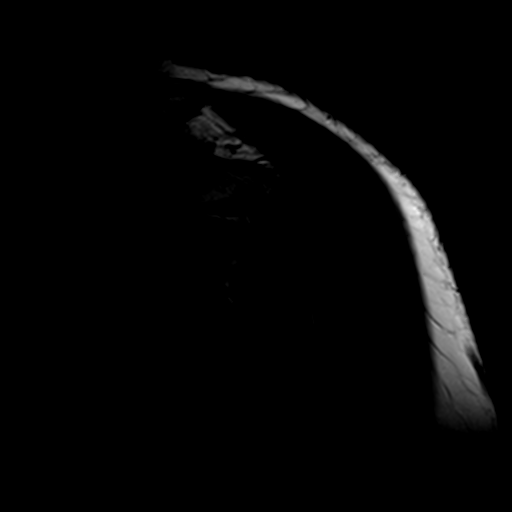
[im 3/15]
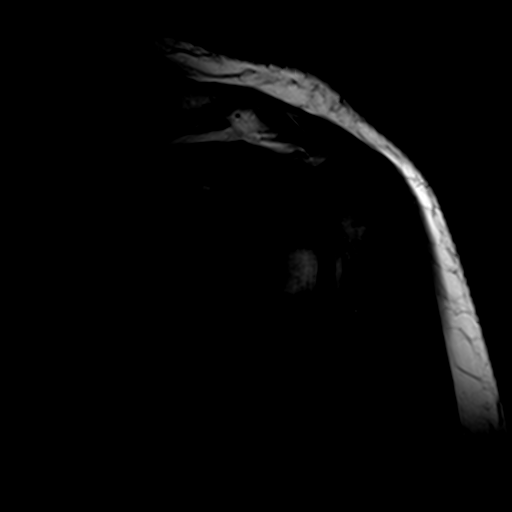
[im 5/15]
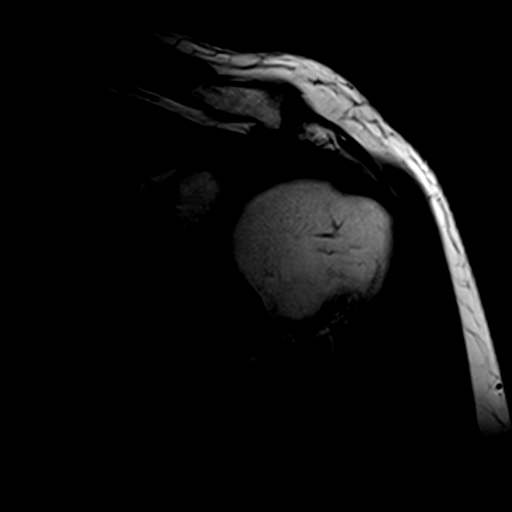
[im 8/15]
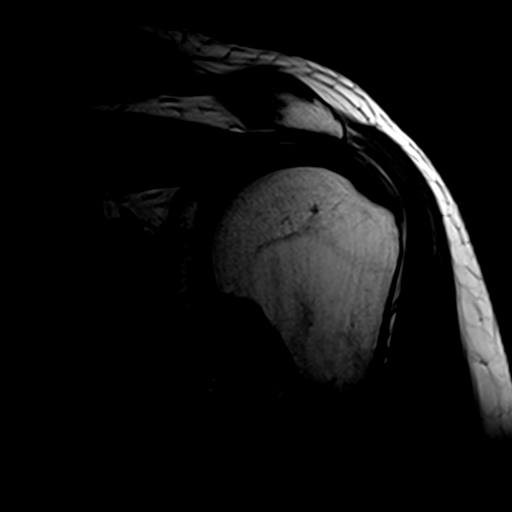
[im 10/15]
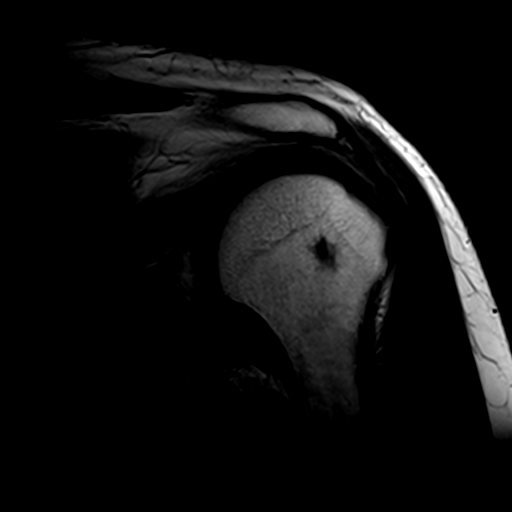
[im 12/15]
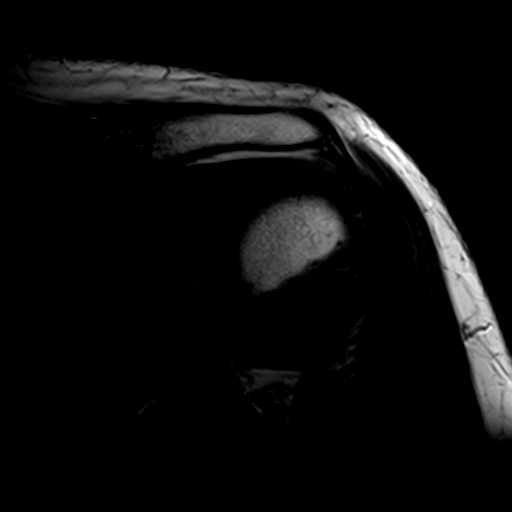
[im 15/15]
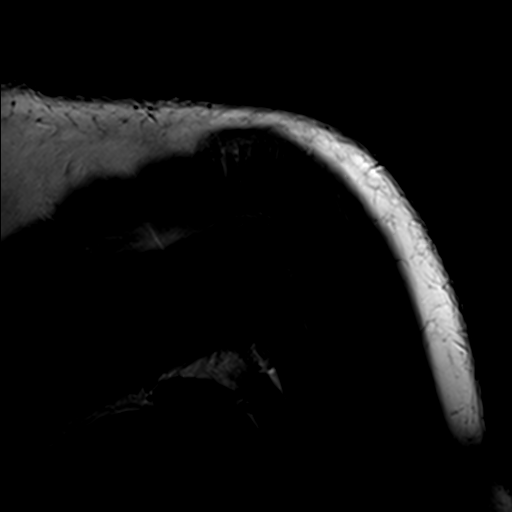

[29 of 40 positions shown; findings below may reference images not displayed]

FINDINGS: Rotator cuff: Moderate tendinosis of the supraspinatus tendon. Mild
tendinosis of the infraspinatus tendon with a small
partial-thickness articular surface tear. Teres minor tendon is
intact. Subscapularis tendon is intact.

Muscles: No atrophy or fatty replacement of nor abnormal signal
within, the muscles of the rotator cuff.

Biceps long head:  Intact.

Acromioclavicular Joint: Moderate arthropathy of the
acromioclavicular joint. Type I acromion. Small amount of
subacromial/subdeltoid bursal fluid.

Glenohumeral Joint: No joint effusion.  No chondral defect.

Labrum:  Posterior labral tear.

Bones:  No acute osseous abnormality.  No aggressive osseous lesion.

Other: No fluid collection or hematoma.
IMPRESSION: 1. Moderate tendinosis of the supraspinatus tendon.
2. Mild tendinosis of the infraspinatus tendon with a small
partial-thickness articular surface tear.
3. Small amount of subacromial/subdeltoid bursal fluid.

## 2019-09-14 ENCOUNTER — Encounter: Payer: Self-pay | Admitting: Family Medicine

## 2019-09-14 ENCOUNTER — Other Ambulatory Visit: Payer: Self-pay

## 2019-09-14 ENCOUNTER — Ambulatory Visit: Payer: BC Managed Care – PPO | Admitting: Family Medicine

## 2019-09-14 VITALS — BP 140/86 | HR 76 | Temp 97.7°F | Wt 248.4 lb

## 2019-09-14 DIAGNOSIS — M7918 Myalgia, other site: Secondary | ICD-10-CM | POA: Diagnosis not present

## 2019-09-14 NOTE — Patient Instructions (Signed)
Take 2 ibuprofen 4 times per day and if you need extra relief then go with 3 ibuprofen 4 times per day.  If that does not work let me know

## 2019-09-14 NOTE — Progress Notes (Signed)
   Subjective:    Patient ID: JOSEANGEL NETTLETON, male    DOB: 05/26/1965, 54 y.o.   MRN: [id number]  HPI He complains of a 1 week history of right flank pain that he noticed when he woke up 1 morning.  He says he can palpate the area of discomfort.  Is made worse with motion, coughing or straining.  No history of injury or overuse.  No nausea, vomiting, chest pain.   Review of Systems     Objective:   Physical Exam Slight tenderness to palpation in the right CVA.  Pain is made worse with motion and with rotation of his back.      Assessment & Plan:  Musculoskeletal pain Take 2 ibuprofen 4 times per day and if you need extra relief then go with 3 ibuprofen 4 times per day.  If that does not work let me know.  He was comfortable with

## 2019-11-16 ENCOUNTER — Encounter: Payer: Self-pay | Admitting: Family Medicine

## 2019-11-16 ENCOUNTER — Other Ambulatory Visit: Payer: Self-pay

## 2019-11-16 ENCOUNTER — Ambulatory Visit: Payer: BC Managed Care – PPO | Admitting: Family Medicine

## 2019-11-16 VITALS — BP 140/82 | HR 76 | Temp 97.1°F | Wt 245.8 lb

## 2019-11-16 DIAGNOSIS — Z1211 Encounter for screening for malignant neoplasm of colon: Secondary | ICD-10-CM | POA: Diagnosis not present

## 2019-11-16 DIAGNOSIS — M25511 Pain in right shoulder: Secondary | ICD-10-CM | POA: Diagnosis not present

## 2019-11-16 DIAGNOSIS — E1159 Type 2 diabetes mellitus with other circulatory complications: Secondary | ICD-10-CM | POA: Diagnosis not present

## 2019-11-16 DIAGNOSIS — E119 Type 2 diabetes mellitus without complications: Secondary | ICD-10-CM | POA: Diagnosis not present

## 2019-11-16 DIAGNOSIS — I152 Hypertension secondary to endocrine disorders: Secondary | ICD-10-CM

## 2019-11-16 DIAGNOSIS — E669 Obesity, unspecified: Secondary | ICD-10-CM

## 2019-11-16 DIAGNOSIS — I10 Essential (primary) hypertension: Secondary | ICD-10-CM

## 2019-11-16 DIAGNOSIS — Z79899 Other long term (current) drug therapy: Secondary | ICD-10-CM

## 2019-11-16 LAB — POCT GLYCOSYLATED HEMOGLOBIN (HGB A1C): Hemoglobin A1C: 7.3 % — AB (ref 4.0–5.6)

## 2019-11-16 MED ORDER — TERAZOSIN HCL 2 MG PO CAPS: 2.0000 mg | ORAL_CAPSULE | Freq: Every day | ORAL | 3 refills | Status: DC

## 2019-11-16 NOTE — Patient Instructions (Signed)
Take 2 of your Terazisin pills until they run out and then start on the new dosing which will be 2 mg

## 2019-11-16 NOTE — Progress Notes (Signed)
Subjective:    Patient ID: Marcus Holland, male    DOB: 1966-04-04, 54 y.o.   MRN: [id number]  Marcus Holland is a 54 y.o. male who presents for follow-up of Type 2 diabetes mellitus.  Home blood sugar records: meter record, post meal, 120-220 Current symptoms/problems include none at this time. Daily foot checks:yes   Any foot concerns: none  Exercise: staying active his work keeps him from being involved in an exercise program. Diet: between fair and good  He continues on amlodipine, lisinopril/HCTZ and Terrazas in.  Having no difficulty with them.  Atorvastatin is causing no aches or pains.  He continues on Metformin and Actos without difficulty.  He does not smoke or drink.  He has had recent pain in the right arm has been going on for several months.  He describes internal as well as external rotation and abduction is causing symptoms.  No history of injuries to the shoulder.  He has had rehab on the left shoulder which did help.  He apparently also had injections in the left shoulder without much benefit. The following portions of the patient's history were reviewed and updated as appropriate: allergies, current medications, past medical history, past social history and problem list.  ROS as in subjective above.     Objective:    Physical Exam Alert and in no distress slight limitation of the external as well as internal rotation.  Drop arm test was negative.  Supraspinatus testing was negative.  Neer's and Hawkins test was uncomfortable.  Hemoglobin A1c 7.3  Lab Review Diabetic Labs Latest Ref Rng & Units 05/04/2019 03/25/2018 03/17/2018 10/10/2017 06/12/2017  HbA1c 4.0 - 5.6 % 7.6(A) 6.7(A) 6.9(A) 6.8(A) 6.5  Microalbumin mg/L - - - - -  Micro/Creat Ratio - - - - - -  Chol 100 - 199 mg/dL 916 384 - - -  HDL >66 mg/dL 59(D) 40 - - -  Calc LDL 0 - 99 mg/dL 69 58 - - -  Triglycerides 0 - 149 mg/dL 99 98 - - -  Creatinine 0.76 - 1.27 mg/dL 3.57 0.17 - - -   BP/Weight 11/16/2019 09/14/2019  07/13/2019 05/04/2019 03/27/2018  Systolic BP 140 140 134 120 124  Diastolic BP 82 86 80 82 80  Wt. (Lbs) 245.8 248.4 249.2 245.4 235  BMI 32.43 32.77 32.88 32.38 31   Foot/eye exam completion dates Latest Ref Rng & Units 07/13/2019 03/25/2018  Eye Exam No Retinopathy - -  Foot Form Completion - Done Done    Marcus Holland  reports that he has never smoked. He has never used smokeless tobacco. He reports that he does not drink alcohol and does not use drugs.     Assessment & Plan:    Type 2 diabetes mellitus without complication, without long-term current use of insulin (HCC) - Plan: POCT glycosylated hemoglobin (Hb A1C)  Acute pain of right shoulder - Plan: Ambulatory referral to Physical Therapy  Screening for colon cancer - Plan: Cologuard  Hypertension associated with diabetes (HCC) - Plan: terazosin (HYTRIN) 2 MG capsule  Obesity (BMI 30-39.9)  Encounter for long-term (current) use of medications   1. Rx changes: Increase terazosin to 2 mg   he is to take 2 of his 1 mg tablets until gone and then switch to the 2 mg dosing. 2. Education: Reviewed 'ABCs' of diabetes management (respective goals in parentheses):  A1C (<7), blood pressure (<130/80), and cholesterol (LDL <100). 3. Compliance at present is estimated to be fair. Efforts to  improve compliance (if necessary) will be directed at increased exercise.  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 4. Follow up: 4 months

## 2019-11-22 ENCOUNTER — Other Ambulatory Visit: Payer: Self-pay | Admitting: Family Medicine

## 2019-11-22 DIAGNOSIS — E119 Type 2 diabetes mellitus without complications: Secondary | ICD-10-CM

## 2019-11-30 ENCOUNTER — Other Ambulatory Visit: Payer: Self-pay

## 2019-11-30 ENCOUNTER — Encounter: Payer: Self-pay | Admitting: Physical Therapy

## 2019-11-30 ENCOUNTER — Ambulatory Visit: Payer: BC Managed Care – PPO | Attending: Family Medicine | Admitting: Physical Therapy

## 2019-11-30 DIAGNOSIS — M25511 Pain in right shoulder: Secondary | ICD-10-CM | POA: Diagnosis not present

## 2019-11-30 NOTE — Therapy (Signed)
[facility] [address] [place], [place], [address] Phone: [phone]   Fax:  [phone]  Physical Therapy Evaluation  Patient Details  Name: Marcus Holland MRN: [id number] Date of Birth: [birth year] Referring Provider (PT): Sharlot Gowda, MD   Encounter Date: 11/30/2019   PT End of Session - 11/30/19 1025    Visit Number 1    Number of Visits 6   plan to focus on HEP progression per pt. request due to work/travel schedule   Date for PT Re-Evaluation 01/25/20    Authorization Type BCBS    PT Start Time 0758    PT Stop Time 0842    PT Time Calculation (min) 44 min    Activity Tolerance Patient tolerated treatment well    Behavior During Therapy [facility] for tasks assessed/performed           Past Medical History:  Diagnosis Date  . Diabetes mellitus without complication (HCC)   . Elevated lipids   . Hypertension   . Obesity   . Poor compliance     Past Surgical History:  Procedure Laterality Date  . CHOLECYSTECTOMY  2016  . COLONOSCOPY  2004   Dr. Elnoria Howard  . FINGER SURGERY      There were no vitals filed for this visit.    Subjective Assessment - 11/30/19 0801    Subjective Pt. is a 54 y/o male referred to PT for c/o right shoulder pain x 3 months of insidious onset. He reports pain and difficulty reaching behind back with right arm as well as pain with abduction motions. Past history left shoulder pain with similar symptoms about 2 years ago improved with PT.    Pertinent History diabetic, history left shoulder pain    Limitations House hold activities;Lifting    Patient Stated Goals Get shoulder better    Currently in Pain? Yes    Pain Score --   no pain at rest, pain up to 8-9/10 with reaching   Pain Location Shoulder    Pain Orientation Right    Pain Descriptors / Indicators Sharp    Pain Type Acute pain    Pain Radiating Towards upper arm    Pain Onset More than a month ago    Pain Frequency Intermittent     Aggravating Factors  reaching movements, shoulder rotation, reaching behind back    Pain Relieving Factors better with rest    Effect of Pain on Daily Activities limits ability reaching activities              [facility] PT Assessment - 11/30/19 0001      Assessment   Medical Diagnosis Acute pain of right shoulder    Referring Provider (PT) Sharlot Gowda, MD    Onset Date/Surgical Date 08/30/19    Hand Dominance Left    Prior Therapy past PT for left shoulder      Precautions   Precautions None      Restrictions   Weight Bearing Restrictions No      Balance Screen   Has the patient fallen in the past 6 months No      Prior Function   Level of Independence Independent with basic ADLs      Cognition   Overall Cognitive Status Within Functional Limits for tasks assessed      Observation/Other Assessments   Focus on Therapeutic Outcomes (FOTO)  --   not assessed-not set up in system at time of eval     Posture/Postural Control  Posture Comments Mild rounding of shoulder      ROM / Strength   AROM / PROM / Strength AROM;PROM;Strength      AROM   AROM Assessment Site Shoulder    Right/Left Shoulder Right;Left    Right Shoulder Flexion 155 Degrees    Right Shoulder ABduction 130 Degrees    Right Shoulder Internal Rotation --   reach to T10   Right Shoulder External Rotation --   reach to T2   Left Shoulder Flexion 170 Degrees    Left Shoulder ABduction 180 Degrees    Left Shoulder Internal Rotation --   reach to T8   Left Shoulder External Rotation --   reach toT3     PROM   PROM Assessment Site Shoulder    Right/Left Shoulder Right    Right Shoulder Flexion 160 Degrees    Right Shoulder ABduction 160 Degrees    Right Shoulder Internal Rotation 65 Degrees    Right Shoulder External Rotation 90 Degrees      Strength   Strength Assessment Site Shoulder;Elbow    Right/Left Shoulder Right;Left    Right Shoulder Flexion 5/5    Right Shoulder ABduction 5/5    Right  Shoulder Internal Rotation 5/5    Right Shoulder External Rotation 5/5    Left Shoulder Flexion 5/5    Left Shoulder ABduction 5/5    Left Shoulder Internal Rotation 5/5    Left Shoulder External Rotation 5/5    Right/Left Elbow Right;Left    Right Elbow Flexion 5/5    Right Elbow Extension 5/5    Left Elbow Flexion 5/5    Left Elbow Extension 5/5      Special Tests   Other special tests (+) painful arc, (-) infraspinatus                      Objective measurements completed on examination: See above findings.       [facility] Adult PT Treatment/Exercise - 11/30/19 0001      Exercises   Exercises --   HEP handout review/verbal HEP instruction                 PT Education - 11/30/19 1025    Education Details shoulder anatomy and symptom etiology, HEP, POC    Person(s) Educated Patient    Methods Explanation;Demonstration;Verbal cues;Handout    Comprehension Verbalized understanding               PT Long Term Goals - 11/30/19 1137      PT LONG TERM GOAL #1   Title Independent with HEP    Baseline needs HEP    Time 8    Period Weeks    Status New    Target Date 01/25/20      PT LONG TERM GOAL #2   Title Increase right shoulder abduction AROM at least 20 deg to improve ability for overhead reaching for ADLs and chores    Baseline 130 deg    Time 8    Period Weeks    Status New    Target Date 01/25/20      PT LONG TERM GOAL #3   Title No sleep disturbance due to right shoulder pain    Time 8    Period Weeks    Status New    Target Date 01/25/20      PT LONG TERM GOAL #4   Title Perform reaching ADLs and lifting activities with right shoulder pain 3/10  or less at worst    Baseline 8-9/10 pain at worst with reaching    Time 8    Period Weeks    Status New    Target Date 01/25/20                  Plan - 11/30/19 1027    Clinical Impression Statement Pt. presents with 3 month history of right shoulder pain with findings  consistent with likely rotator cuff impingement/tendinitis given painful abduction. Good rotator cuff strength so would suspect likelihood of frank tear low. Pt. would benefit from PT to help relieve pain and address associated functional limitations for right shoulder/reaching ability.    Personal Factors and Comorbidities Comorbidity 1    Comorbidities diabetic    Examination-Activity Limitations Bathing;Dressing;Sleep;Lift;Hygiene/Grooming    Examination-Participation Restrictions Cleaning   reaching ADLs   Stability/Clinical Decision Making Stable/Uncomplicated    Clinical Decision Making Low    Rehab Potential Good    PT Frequency --   6 visits   PT Duration 8 weeks    PT Treatment/Interventions ADLs/Self Care Home Management;Cryotherapy;Ultrasound;Electrical Stimulation;Iontophoresis 4mg /ml Dexamethasone;Moist Heat;Therapeutic activities;Therapeutic exercise;Neuromuscular re-education;Manual techniques;Dry needling;Taping    PT Next Visit Plan review HEP as needed, focus rotator cuff/peroscapular strengthening and stabilization, potential dry needling posterior rotator cuff    PT Home Exercise Plan Access code: [email or web address]    Consulted and Agree with Plan of Care Patient           Patient will benefit from skilled therapeutic intervention in order to improve the following deficits and impairments:  Pain, Impaired flexibility, Decreased range of motion, Impaired UE functional use  Visit Diagnosis: Acute pain of right shoulder     Problem List Patient Active Problem List   Diagnosis Date Noted  . Impingement syndrome of left shoulder 08/19/2017  . Obesity (BMI 30-39.9) 08/11/2015  . Hyperlipidemia associated with type 2 diabetes mellitus (HCC) 05/03/2013  . GERD (gastroesophageal reflux disease) 05/03/2013  . DM2 (diabetes mellitus, type 2) (HCC) 01/10/2012    01/12/2012, PT, DPT 11/30/19 11:50 AM  [facility] [address] [place], [place], [place] Phone: [phone]   Fax:  [phone]  Name: Marcus Holland MRN: Shela Nevin Date of Birth: [birth year]

## 2019-12-01 DIAGNOSIS — Z20828 Contact with and (suspected) exposure to other viral communicable diseases: Secondary | ICD-10-CM | POA: Diagnosis not present

## 2019-12-08 ENCOUNTER — Other Ambulatory Visit: Payer: Self-pay | Admitting: Family Medicine

## 2019-12-08 DIAGNOSIS — E119 Type 2 diabetes mellitus without complications: Secondary | ICD-10-CM

## 2019-12-08 DIAGNOSIS — Z1211 Encounter for screening for malignant neoplasm of colon: Secondary | ICD-10-CM | POA: Diagnosis not present

## 2019-12-08 LAB — COLOGUARD: Cologuard: NEGATIVE

## 2019-12-09 LAB — COLOGUARD

## 2019-12-14 LAB — COLOGUARD: COLOGUARD: NEGATIVE

## 2019-12-15 NOTE — Progress Notes (Signed)
Pt was advised of negative cologurad test. [facility]

## 2019-12-16 ENCOUNTER — Encounter: Payer: Self-pay | Admitting: Family Medicine

## 2019-12-17 ENCOUNTER — Other Ambulatory Visit: Payer: Self-pay

## 2019-12-17 ENCOUNTER — Encounter: Payer: Self-pay | Admitting: Physical Therapy

## 2019-12-17 ENCOUNTER — Ambulatory Visit: Payer: BC Managed Care – PPO | Admitting: Physical Therapy

## 2019-12-17 DIAGNOSIS — M25511 Pain in right shoulder: Secondary | ICD-10-CM

## 2019-12-17 NOTE — Therapy (Signed)
[facility] [address] [place], [place], [address] Phone: [phone]   Fax:  [phone]  Physical Therapy Treatment  Patient Details  Name: Marcus Holland MRN: [id number] Date of Birth: 01/28/1966 Referring Provider (PT): Sharlot Gowda, MD   Encounter Date: 12/17/2019   PT End of Session - 12/17/19 0854    Visit Number 2    Number of Visits 6    Date for PT Re-Evaluation 01/25/20    Authorization Type BCBS    PT Start Time 0800    PT Stop Time 0846    PT Time Calculation (min) 46 min    Activity Tolerance Patient tolerated treatment well    Behavior During Therapy [facility] for tasks assessed/performed           Past Medical History:  Diagnosis Date   Diabetes mellitus without complication (HCC)    Elevated lipids    Hypertension    Obesity    Poor compliance     Past Surgical History:  Procedure Laterality Date   CHOLECYSTECTOMY  2016   COLONOSCOPY  2004   Dr. Elnoria Howard   FINGER SURGERY      There were no vitals filed for this visit.   Subjective Assessment - 12/17/19 0803    Subjective Pt. returns for first follow up visit since initial evaluation 11/30/19. He has been doing HEP at least a few days a week-unfortunately not noting much improvement yet at this point. Pain can be up to 9/10 when "acute"/flaring up with activity but not as bad when at rest/intermittently otherwise. For now he wishes to hold off on dry needling and see how exercises go.    Pertinent History diabetic, history left shoulder pain                             [facility] Adult PT Treatment/Exercise - 12/17/19 0001      Exercises   Exercises Shoulder      Shoulder Exercises: Supine   Horizontal ABduction AROM;Strengthening;Both;20 reps    Horizontal ABduction Limitations inclduing crossing "X" pattern    Other Supine Exercises supine rhythmic stabilzation 20 sec x 3    Other Supine Exercises supine shoulder flexion with  bilat. horizontal abduction isometric with blue Theraband x10      Shoulder Exercises: Standing   External Rotation AROM;Strengthening;Right;20 reps    Theraband Level (Shoulder External Rotation) Level 3 (Green)    Internal Rotation AROM;Strengthening;Right;20 reps    Theraband Level (Shoulder Internal Rotation) Level 3 (Green)    Extension AROM;Strengthening;Both;20 reps    Row AROM;Strengthening;Both;20 reps    Theraband Level (Shoulder Row) Level 4 (Blue)    Other Standing Exercises right side scaption 2 lbs. 2x10    Other Standing Exercises wall push up with a plus 2x10, small ball circles at wall x 15 ea. way CW/CCW      Shoulder Exercises: ROM/Strengthening   UBE (Upper Arm Bike) L1 x 4 min with 2 min ea. few/rev      Manual Therapy   Manual Therapy Soft tissue mobilization    Soft tissue mobilization right posterior shoulder/rotator cuff trigger point STM                  PT Education - 12/17/19 0854    Education Details HEP updates, symptom etiology, POC, Theracane use for posterior shoulder STM    Person(s) Educated Patient    Methods Explanation    Comprehension  Verbalized understanding               PT Long Term Goals - 11/30/19 1137      PT LONG TERM GOAL #1   Title Independent with HEP    Baseline needs HEP    Time 8    Period Weeks    Status New    Target Date 01/25/20      PT LONG TERM GOAL #2   Title Increase right shoulder abduction AROM at least 20 deg to improve ability for overhead reaching for ADLs and chores    Baseline 130 deg    Time 8    Period Weeks    Status New    Target Date 01/25/20      PT LONG TERM GOAL #3   Title No sleep disturbance due to right shoulder pain    Time 8    Period Weeks    Status New    Target Date 01/25/20      PT LONG TERM GOAL #4   Title Perform reaching ADLs and lifting activities with right shoulder pain 3/10 or less at worst    Baseline 8-9/10 pain at worst with reaching    Time 8    Period  Weeks    Status New    Target Date 01/25/20                 Plan - 12/17/19 0855    Clinical Impression Statement Limited progess thus far with HEP with continued shoulder impingment symptoms/pain in abduction but given symptom etiology expect tx. results may take more time for effect. Reviewed and updated HEP with focus rotator cuff/periscapular stabilization-plan have pt. continue with HEP for another 2-3 weeks-if not improving with exercises may consider trial dry needling. Ultimately if failing to improve will recommend follow up with MD for further assessment but for now plan continue PT plan of care.    Personal Factors and Comorbidities Comorbidity 1    Comorbidities diabetic    Examination-Activity Limitations Bathing;Dressing;Sleep;Lift;Hygiene/Grooming    Examination-Participation Restrictions Cleaning    Stability/Clinical Decision Making Stable/Uncomplicated    Clinical Decision Making Low    Rehab Potential Good    PT Frequency --   6 visits   PT Duration 8 weeks    PT Treatment/Interventions ADLs/Self Care Home Management;Cryotherapy;Ultrasound;Electrical Stimulation;Iontophoresis 4mg /ml Dexamethasone;Moist Heat;Therapeutic activities;Therapeutic exercise;Neuromuscular re-education;Manual techniques;Dry needling;Taping    PT Next Visit Plan review HEP as needed, focus rotator cuff/peroscapular strengthening and stabilization, potential dry needling posterior rotator cuff    PT Home Exercise Plan Access code: [email or web address]    Consulted and Agree with Plan of Care Patient           Patient will benefit from skilled therapeutic intervention in order to improve the following deficits and impairments:  Pain, Impaired flexibility, Decreased range of motion, Impaired UE functional use  Visit Diagnosis: Acute pain of right shoulder     Problem List Patient Active Problem List   Diagnosis Date Noted   Impingement syndrome of left shoulder 08/19/2017   Obesity (BMI  30-39.9) 08/11/2015   Hyperlipidemia associated with type 2 diabetes mellitus (HCC) 05/03/2013   GERD (gastroesophageal reflux disease) 05/03/2013   DM2 (diabetes mellitus, type 2) (HCC) 01/10/2012    01/12/2012, PT, DPT 12/17/19 8:59 AM  [facility] [address] [place], [place], [place] Phone: [phone]   Fax:  [phone]  Name: WILLETT LEFEBER MRN: Shela Nevin Date of Birth: 1965-09-23

## 2020-01-07 ENCOUNTER — Ambulatory Visit: Payer: BC Managed Care – PPO | Admitting: Physical Therapy

## 2020-03-03 NOTE — Therapy (Signed)
[place], [place], [address] Phone: [phone]   Fax:  [phone]  Physical Therapy Treatment  Patient Details  Name: Marcus Holland MRN: [id number] Date of Birth: October 01, 1965 Referring Provider (PT): Jill Alexanders, MD   Encounter Date: 12/17/2019    Past Medical History:  Diagnosis Date  . Diabetes mellitus without complication ([place])   . Elevated lipids   . Hypertension   . Obesity   . Poor compliance     Past Surgical History:  Procedure Laterality Date  . CHOLECYSTECTOMY  2016  . COLONOSCOPY  2004   Dr. Benson Norway  . FINGER SURGERY      There were no vitals filed for this visit.                                   PT Long Term Goals - 11/30/19 1137      PT LONG TERM GOAL #1   Title Independent with HEP    Baseline needs HEP    Time 8    Period Weeks    Status New    Target Date 01/25/20      PT LONG TERM GOAL #2   Title Increase right shoulder abduction AROM at least 20 deg to improve ability for overhead reaching for ADLs and chores    Baseline 130 deg    Time 8    Period Weeks    Status New    Target Date 01/25/20      PT LONG TERM GOAL #3   Title No sleep disturbance due to right shoulder pain    Time 8    Period Weeks    Status New    Target Date 01/25/20      PT LONG TERM GOAL #4   Title Perform reaching ADLs and lifting activities with right shoulder pain 3/10 or less at worst    Baseline 8-9/10 pain at worst with reaching    Time 8    Period Weeks    Status New    Target Date 01/25/20                  Patient will benefit from skilled therapeutic intervention in order to improve the following deficits and impairments:  Pain, Impaired flexibility, Decreased range of motion, Impaired UE functional use  Visit Diagnosis: Acute pain of right shoulder     Problem List Patient Active Problem List   Diagnosis Date Noted  .  Impingement syndrome of left shoulder 08/19/2017  . Obesity (BMI 30-39.9) 08/11/2015  . Hyperlipidemia associated with type 2 diabetes mellitus ([place]) 05/03/2013  . GERD (gastroesophageal reflux disease) 05/03/2013  . DM2 (diabetes mellitus, type 2) ([place]) 01/10/2012       PHYSICAL THERAPY DISCHARGE SUMMARY  Visits from Start of Care: 2  Current functional level related to goals / functional outcomes: Patient did not return for further therapy after last visit 12/17/19 with last follow up visit cancelled. Current status unknown.   Remaining deficits: Status unknown   Education / Equipment: Previous Copy Plan: Patient agrees to discharge.  Patient goals were not met. Patient is being discharged due to not returning since the last visit.  ?????           Beaulah Dinning, PT, DPT 03/03/20 10:48 AM      [facility] [address] [place], [place], [address]  Phone: [phone]   Fax:  [phone]  Name: Marcus Holland MRN: [id number] Date of Birth: [birth year]

## 2020-03-20 ENCOUNTER — Other Ambulatory Visit: Payer: Self-pay

## 2020-03-20 ENCOUNTER — Encounter: Payer: Self-pay | Admitting: Family Medicine

## 2020-03-20 ENCOUNTER — Ambulatory Visit (INDEPENDENT_AMBULATORY_CARE_PROVIDER_SITE_OTHER): Payer: BC Managed Care – PPO | Admitting: Family Medicine

## 2020-03-20 VITALS — BP 128/82 | HR 67 | Temp 97.2°F | Wt 246.8 lb

## 2020-03-20 DIAGNOSIS — I152 Hypertension secondary to endocrine disorders: Secondary | ICD-10-CM

## 2020-03-20 DIAGNOSIS — E669 Obesity, unspecified: Secondary | ICD-10-CM | POA: Diagnosis not present

## 2020-03-20 DIAGNOSIS — Z79899 Other long term (current) drug therapy: Secondary | ICD-10-CM

## 2020-03-20 DIAGNOSIS — E119 Type 2 diabetes mellitus without complications: Secondary | ICD-10-CM

## 2020-03-20 DIAGNOSIS — E1159 Type 2 diabetes mellitus with other circulatory complications: Secondary | ICD-10-CM

## 2020-03-20 DIAGNOSIS — E785 Hyperlipidemia, unspecified: Secondary | ICD-10-CM

## 2020-03-20 DIAGNOSIS — E1169 Type 2 diabetes mellitus with other specified complication: Secondary | ICD-10-CM

## 2020-03-20 LAB — POCT GLYCOSYLATED HEMOGLOBIN (HGB A1C): Hemoglobin A1C: 7.7 % — AB (ref 4.0–5.6)

## 2020-03-20 NOTE — Progress Notes (Signed)
  Subjective:    Patient ID: Marcus Holland, male    DOB: [birth year], 54 y.o.   MRN: [id number]  Marcus Holland is a 54 y.o. male who presents for follow-up of Type 2 diabetes mellitus.  Home blood sugar records: meter records, post meal, 125- 190 Current symptoms/problems include none at this time. Daily foot checks:yes   Any foot concerns: none Exercise: staying active at least 20 min  Diet: Fair  Ulcers record he continues on Metformin and Actos for his blood sugar and having no difficulty with that.  He is also taking Hytrin, Norvasc and lisinopril/HCTZ for blood pressure.  Continues on atorvastatin without aches or pains.  His physical activity level revolves around work.  He does not smoke or drink. The following portions of the patient's history were reviewed and updated as appropriate: allergies, current medications, past medical history, past social history and problem list.  ROS as in subjective above.     Objective:    Physical Exam Alert and in no distress otherwise not examined. Hemoglobin A1c 7.7  Lab Review Diabetic Labs Latest Ref Rng & Units 11/16/2019 05/04/2019 03/25/2018 03/17/2018 10/10/2017  HbA1c 4.0 - 5.6 % 7.3(A) 7.6(A) 6.7(A) 6.9(A) 6.8(A)  Microalbumin mg/L - - - - -  Micro/Creat Ratio - - - - - -  Chol 100 - 199 mg/dL - 725 366 - -  HDL >44 mg/dL - 03(K) 40 - -  Calc LDL 0 - 99 mg/dL - 69 58 - -  Triglycerides 0 - 149 mg/dL - 99 98 - -  Creatinine 0.76 - 1.27 mg/dL - 7.42 5.95 - -   BP/Weight 11/16/2019 09/14/2019 07/13/2019 05/04/2019 03/27/2018  Systolic BP 140 140 134 120 124  Diastolic BP 82 86 80 82 80  Wt. (Lbs) 245.8 248.4 249.2 245.4 235  BMI 32.43 32.77 32.88 32.38 31   Foot/eye exam completion dates Latest Ref Rng & Units 07/13/2019 03/25/2018  Eye Exam No Retinopathy - -  Foot Form Completion - Done Done    Marcus Holland  reports that he has never smoked. He has never used smokeless tobacco. He reports that he does not drink alcohol and does not use drugs.       Assessment & Plan:    Type 2 diabetes mellitus without complication, without long-term current use of insulin (HCC) - Plan: POCT glycosylated hemoglobin (Hb A1C)  Hypertension associated with diabetes (HCC)  Obesity (BMI 30-39.9)  Encounter for long-term (current) use of medications  Hyperlipidemia associated with type 2 diabetes mellitus (HCC)   1. Rx changes: none 2. Education: Reviewed 'ABCs' of diabetes management (respective goals in parentheses):  A1C (<7), blood pressure (<130/80), and cholesterol (LDL <100). 3. Compliance at present is estimated to be fair. Efforts to improve compliance (if necessary) will be directed at increased exercise. 4. Follow up: 4 months I again encouraged him to make diet and exercise changes.  Discussed the fact that another medication would be appropriate but he is not interested.  Discussed options in terms of placing him on an SGLT2 medication in the future and the benefits with that. And then discussed Covid vaccination.  He is not interested.

## 2020-04-24 ENCOUNTER — Other Ambulatory Visit: Payer: Self-pay

## 2020-04-24 ENCOUNTER — Telehealth: Payer: BC Managed Care – PPO | Admitting: Family Medicine

## 2020-04-24 ENCOUNTER — Encounter: Payer: Self-pay | Admitting: Family Medicine

## 2020-04-24 ENCOUNTER — Other Ambulatory Visit (INDEPENDENT_AMBULATORY_CARE_PROVIDER_SITE_OTHER): Payer: BC Managed Care – PPO

## 2020-04-24 VITALS — Ht 73.0 in | Wt 230.0 lb

## 2020-04-24 DIAGNOSIS — R509 Fever, unspecified: Secondary | ICD-10-CM

## 2020-04-24 DIAGNOSIS — R531 Weakness: Secondary | ICD-10-CM

## 2020-04-24 LAB — POCT INFLUENZA A/B
Influenza A, POC: NEGATIVE
Influenza B, POC: NEGATIVE

## 2020-04-24 LAB — POC COVID19 BINAXNOW: SARS Coronavirus 2 Ag: POSITIVE — AB

## 2020-04-24 NOTE — Progress Notes (Addendum)
   Subjective:    Patient ID: Marcus Holland, male    DOB: [birth year], 55 y.o.   MRN: [id number]  HPI I connected with  RODDRICK SHARRON on 04/24/20 by a video enabled telemedicine application and verified that I am speaking with the correct person using two identifiers.  Caregility used.  Me: Office.  Patient: At home I discussed the limitations of evaluation and management by telemedicine. The patient expressed understanding and agreed to proceed. He states that last Monday he had the sudden onset of fever, chills, weakness, slight cough but no sore throat or earache.  The symptoms have essentially continued since then.  Apparently his wife has similar symptoms started couple days after that.  He has not had the vaccine.   Review of Systems     Objective:   Physical Exam Alert and in no distress.  His voice pattern appeared normal.  No evidence of tachypnea.       Assessment & Plan:  Fever and chills - Plan: POC COVID-19, Novel Coronavirus, NAA ([facility]), Influenza A/B  Weakness - Plan: POC COVID-19, Novel Coronavirus, NAA ([facility]), Influenza A/B Recommend he treat his symptoms with Tylenol/Advil or Aleve.  Discussed the possibility of Covid and what to do if it is indeed positive.  He expressed understanding of this. 20 minutes spent today reviewing his chart, history, documentation and coordination of care.   The rapid screen came back positive.  I discussed the diagnosis with him at this point his main symptoms are fatigue and dizziness.  Recommended he keep himself well-hydrated and listen to his body.  Explained that he should not return until he notes that his symptoms are better specifically the fatigue.  If he has any questions about other symptoms, he is to call.

## 2020-05-15 ENCOUNTER — Other Ambulatory Visit: Payer: Self-pay | Admitting: Family Medicine

## 2020-05-15 DIAGNOSIS — E119 Type 2 diabetes mellitus without complications: Secondary | ICD-10-CM

## 2020-06-06 ENCOUNTER — Other Ambulatory Visit: Payer: Self-pay | Admitting: Family Medicine

## 2020-06-06 DIAGNOSIS — I152 Hypertension secondary to endocrine disorders: Secondary | ICD-10-CM

## 2020-06-06 DIAGNOSIS — E1159 Type 2 diabetes mellitus with other circulatory complications: Secondary | ICD-10-CM

## 2020-06-06 DIAGNOSIS — E119 Type 2 diabetes mellitus without complications: Secondary | ICD-10-CM

## 2020-06-26 ENCOUNTER — Other Ambulatory Visit: Payer: Self-pay | Admitting: Family Medicine

## 2020-06-26 DIAGNOSIS — E785 Hyperlipidemia, unspecified: Secondary | ICD-10-CM

## 2020-06-26 DIAGNOSIS — E1169 Type 2 diabetes mellitus with other specified complication: Secondary | ICD-10-CM

## 2020-07-18 ENCOUNTER — Ambulatory Visit: Payer: BC Managed Care – PPO | Admitting: Family Medicine

## 2020-07-18 ENCOUNTER — Other Ambulatory Visit: Payer: Self-pay

## 2020-07-18 ENCOUNTER — Encounter: Payer: Self-pay | Admitting: Family Medicine

## 2020-07-18 VITALS — BP 130/82 | HR 74 | Temp 96.3°F | Wt 237.6 lb

## 2020-07-18 DIAGNOSIS — I152 Hypertension secondary to endocrine disorders: Secondary | ICD-10-CM | POA: Diagnosis not present

## 2020-07-18 DIAGNOSIS — Z79899 Other long term (current) drug therapy: Secondary | ICD-10-CM

## 2020-07-18 DIAGNOSIS — E669 Obesity, unspecified: Secondary | ICD-10-CM

## 2020-07-18 DIAGNOSIS — E1159 Type 2 diabetes mellitus with other circulatory complications: Secondary | ICD-10-CM

## 2020-07-18 DIAGNOSIS — E119 Type 2 diabetes mellitus without complications: Secondary | ICD-10-CM

## 2020-07-18 DIAGNOSIS — E1169 Type 2 diabetes mellitus with other specified complication: Secondary | ICD-10-CM

## 2020-07-18 DIAGNOSIS — E785 Hyperlipidemia, unspecified: Secondary | ICD-10-CM

## 2020-07-18 LAB — POCT GLYCOSYLATED HEMOGLOBIN (HGB A1C): Hemoglobin A1C: 7.5 % — AB (ref 4.0–5.6)

## 2020-07-18 LAB — POCT UA - MICROALBUMIN
Albumin/Creatinine Ratio, Urine, POC: <8.9
Creatinine, POC: 5.2 mg/dL
Microalbumin Ur, POC: <5 mg/L

## 2020-07-18 NOTE — Progress Notes (Signed)
  Subjective:    Patient ID: Marcus Holland, male    DOB: 1965/12/21, 55 y.o.   MRN: [id number]  Marcus Holland is a 55 y.o. male who presents for follow-up of Type 2 diabetes mellitus.  Home blood sugar records: recorded by meter, post meal and fasting , 130-300 Current symptoms/problems include none at this time. Daily foot checks: yes   Any foot concerns: none Exercise: staying active 20 min. a day.  He drives a lot but does keep himself busy with the many stops that he makes along the way. Diet :fair He continues on Metformin, pioglitazone and having no difficulty with that.  He is also taking Hytrin, lisinopril/HCTZ and amlodipine for his blood pressure without complaints.  Lipitor is causing no muscle aches or pains.  He did have Covid in January and has recovered completely from that.  He plans to get his eyes examined as soon as he can find an office that is open on Saturday so he does not do take time off from work. The following portions of the patient's history were reviewed and updated as appropriate: allergies, current medications, past medical history, past social history and problem list.  ROS as in subjective above.     Objective:    Physical Exam Alert and in no distress otherwise not examined.  Blood pressure 130/82, pulse 74, temperature (!) 96.3 F (35.7 C), weight 237 lb 9.6 oz (107.8 kg), SpO2 98 %.  Lab Review Diabetic Labs Latest Ref Rng & Units 03/20/2020 11/16/2019 05/04/2019 03/25/2018 03/17/2018  HbA1c 4.0 - 5.6 % 7.7(A) 7.3(A) 7.6(A) 6.7(A) 6.9(A)  Microalbumin mg/L - - - - -  Micro/Creat Ratio - - - - - -  Chol 100 - 199 mg/dL - - 923 300 -  HDL >76 mg/dL - - 22(Q) 40 -  Calc LDL 0 - 99 mg/dL - - 69 58 -  Triglycerides 0 - 149 mg/dL - - 99 98 -  Creatinine 0.76 - 1.27 mg/dL - - 3.33 5.45 -   BP/Weight 07/18/2020 04/24/2020 03/20/2020 11/16/2019 09/14/2019  Systolic BP 130 - 128 140 140  Diastolic BP 82 - 82 82 86  Wt. (Lbs) 237.6 230 246.8 245.8 248.4  BMI 31.35  30.34 32.56 32.43 32.77   Foot/eye exam completion dates Latest Ref Rng & Units 07/13/2019 03/25/2018  Eye Exam No Retinopathy - -  Foot Form Completion - Done Done  Hemoglobin A1c is 7.5  [place]  reports that he has never smoked. He has never used smokeless tobacco. He reports that he does not drink alcohol and does not use drugs.     Assessment & Plan:    Type 2 diabetes mellitus without complication, without long-term current use of insulin (HCC) - Plan: POCT glycosylated hemoglobin (Hb A1C)  1. Rx changes: none 2. Education: Reviewed 'ABCs' of diabetes management (respective goals in parentheses):  A1C (<7), blood pressure (<130/80), and cholesterol (LDL <100). 3. Compliance at present is estimated to be fair. Efforts to improve compliance (if necessary) will be directed at increased exercise.  Also discussed need to continue to cut back on carbohydrates. 4. Follow up: 4 months

## 2020-07-19 LAB — CBC WITH DIFFERENTIAL/PLATELET
Basophils Absolute: 0 10*3/uL (ref 0.0–0.2)
Basos: 0 %
EOS (ABSOLUTE): 0.1 10*3/uL (ref 0.0–0.4)
Eos: 3 %
Hematocrit: 41.9 % (ref 37.5–51.0)
Hemoglobin: 14 g/dL (ref 13.0–17.7)
Immature Grans (Abs): 0 10*3/uL (ref 0.0–0.1)
Immature Granulocytes: 0 %
Lymphocytes Absolute: 1.3 10*3/uL (ref 0.7–3.1)
Lymphs: 28 %
MCH: 29.7 pg (ref 26.6–33.0)
MCHC: 33.4 g/dL (ref 31.5–35.7)
MCV: 89 fL (ref 79–97)
Monocytes Absolute: 0.4 10*3/uL (ref 0.1–0.9)
Monocytes: 8 %
Neutrophils Absolute: 2.8 10*3/uL (ref 1.4–7.0)
Neutrophils: 61 %
Platelets: 271 10*3/uL (ref 150–450)
RBC: 4.72 x10E6/uL (ref 4.14–5.80)
RDW: 12.7 % (ref 11.6–15.4)
WBC: 4.6 10*3/uL (ref 3.4–10.8)

## 2020-07-19 LAB — LIPID PANEL
Chol/HDL Ratio: 3.4 ratio (ref 0.0–5.0)
Cholesterol, Total: 137 mg/dL (ref 100–199)
HDL: 40 mg/dL (ref >39)
LDL Chol Calc (NIH): 79 mg/dL (ref 0–99)
Triglycerides: 92 mg/dL (ref 0–149)
VLDL Cholesterol Cal: 18 mg/dL (ref 5–40)

## 2020-07-19 LAB — COMPREHENSIVE METABOLIC PANEL
ALT: 18 IU/L (ref 0–44)
AST: 13 IU/L (ref 0–40)
Albumin/Globulin Ratio: 2.3 — ABNORMAL HIGH (ref 1.2–2.2)
Albumin: 4.8 g/dL (ref 3.8–4.9)
Alkaline Phosphatase: 97 IU/L (ref 44–121)
BUN/Creatinine Ratio: 13 (ref 9–20)
BUN: 12 mg/dL (ref 6–24)
Bilirubin Total: 0.5 mg/dL (ref 0.0–1.2)
CO2: 25 mmol/L (ref 20–29)
Calcium: 9.8 mg/dL (ref 8.7–10.2)
Chloride: 101 mmol/L (ref 96–106)
Creatinine, Ser: 0.96 mg/dL (ref 0.76–1.27)
Globulin, Total: 2.1 g/dL (ref 1.5–4.5)
Glucose: 170 mg/dL — ABNORMAL HIGH (ref 65–99)
Potassium: 3.8 mmol/L (ref 3.5–5.2)
Sodium: 143 mmol/L (ref 134–144)
Total Protein: 6.9 g/dL (ref 6.0–8.5)
eGFR: 93 mL/min/{1.73_m2} (ref >59)

## 2020-08-08 ENCOUNTER — Other Ambulatory Visit: Payer: Self-pay | Admitting: Family Medicine

## 2020-08-08 DIAGNOSIS — E1159 Type 2 diabetes mellitus with other circulatory complications: Secondary | ICD-10-CM

## 2020-08-08 DIAGNOSIS — I152 Hypertension secondary to endocrine disorders: Secondary | ICD-10-CM

## 2020-10-30 DIAGNOSIS — E119 Type 2 diabetes mellitus without complications: Secondary | ICD-10-CM | POA: Diagnosis not present

## 2020-10-30 DIAGNOSIS — H25041 Posterior subcapsular polar age-related cataract, right eye: Secondary | ICD-10-CM | POA: Diagnosis not present

## 2020-10-30 DIAGNOSIS — H25813 Combined forms of age-related cataract, bilateral: Secondary | ICD-10-CM | POA: Diagnosis not present

## 2020-10-30 DIAGNOSIS — H353131 Nonexudative age-related macular degeneration, bilateral, early dry stage: Secondary | ICD-10-CM | POA: Diagnosis not present

## 2020-10-30 DIAGNOSIS — H35033 Hypertensive retinopathy, bilateral: Secondary | ICD-10-CM | POA: Diagnosis not present

## 2020-10-31 LAB — HM DIABETES EYE EXAM

## 2020-11-06 ENCOUNTER — Encounter: Payer: Self-pay | Admitting: Family Medicine

## 2020-11-06 ENCOUNTER — Ambulatory Visit: Payer: BC Managed Care – PPO | Admitting: Family Medicine

## 2020-11-06 ENCOUNTER — Other Ambulatory Visit: Payer: Self-pay

## 2020-11-06 VITALS — BP 136/82 | HR 71 | Temp 96.1°F | Ht 73.25 in | Wt 244.2 lb

## 2020-11-06 DIAGNOSIS — E669 Obesity, unspecified: Secondary | ICD-10-CM | POA: Diagnosis not present

## 2020-11-06 DIAGNOSIS — E1159 Type 2 diabetes mellitus with other circulatory complications: Secondary | ICD-10-CM | POA: Diagnosis not present

## 2020-11-06 DIAGNOSIS — E785 Hyperlipidemia, unspecified: Secondary | ICD-10-CM

## 2020-11-06 DIAGNOSIS — E119 Type 2 diabetes mellitus without complications: Secondary | ICD-10-CM

## 2020-11-06 DIAGNOSIS — E1169 Type 2 diabetes mellitus with other specified complication: Secondary | ICD-10-CM | POA: Diagnosis not present

## 2020-11-06 DIAGNOSIS — I152 Hypertension secondary to endocrine disorders: Secondary | ICD-10-CM

## 2020-11-06 DIAGNOSIS — Z79899 Other long term (current) drug therapy: Secondary | ICD-10-CM

## 2020-11-06 LAB — POCT GLYCOSYLATED HEMOGLOBIN (HGB A1C): Hemoglobin A1C: 8.5 % — AB (ref 4.0–5.6)

## 2020-11-06 MED ORDER — ERTUGLIFLOZIN-METFORMIN HCL 2.5-1000 MG PO TABS
1.0000 | ORAL_TABLET | Freq: Two times a day (BID) | ORAL | 5 refills | Status: DC
Start: 2020-11-06 — End: 2020-12-07

## 2020-11-06 NOTE — Progress Notes (Signed)
Subjective:    Patient ID: Marcus Holland, male    DOB: 22-Feb-1966, 55 y.o.   MRN: [id number]  Marcus Holland is a 55 y.o. male who presents for follow-up of Type 2 diabetes mellitus.  Home blood sugar records:  Fasting and post meal Current symptoms/problems include none and have been stable. Daily foot checks: yes   Any foot concerns: none at this time Exercise: The patient has a physically strenuous job, but has no regular exercise apart from work.  Diet: Fair His work and travel schedule has changed and is requiring him to do a lot more time in the car which has interfered with his physical activity and eating habits.  That apparently is going to be changed again and in near future.  He continues on amlodipine, lisinopril and Hytrin and having no difficulties with that.  Took Lipitor is causing no muscle aches or pains.  He continues on Actos and metformin without difficulty.  He is taking multivitamins. The following portions of the patient's history were reviewed and updated as appropriate: allergies, current medications, past medical history, past social history and problem list.  ROS as in subjective above.     Objective:    Physical Exam Alert and in no distress otherwise not examined. Hemoglobin A1c is 8.5   Lab Review Diabetic Labs Latest Ref Rng & Units 07/18/2020 03/20/2020 11/16/2019 05/04/2019 03/25/2018  HbA1c 4.0 - 5.6 % 7.5(A) 7.7(A) 7.3(A) 7.6(A) 6.7(A)  Microalbumin mg/L <5.0 - - - -  Micro/Creat Ratio - <8.9 - - - -  Chol 100 - 199 mg/dL 564 - - 332 951  HDL >88 mg/dL 40 - - 41(Y) 40  Calc LDL 0 - 99 mg/dL 79 - - 69 58  Triglycerides 0 - 149 mg/dL 92 - - 99 98  Creatinine 0.76 - 1.27 mg/dL 6.06 - - 3.01 6.01   BP/Weight 07/18/2020 04/24/2020 03/20/2020 11/16/2019 09/14/2019  Systolic BP 130 - 128 140 140  Diastolic BP 82 - 82 82 86  Wt. (Lbs) 237.6 230 246.8 245.8 248.4  BMI 31.35 30.34 32.56 32.43 32.77   Foot/eye exam completion dates Latest Ref Rng & Units 07/13/2019  03/25/2018  Eye Exam No Retinopathy - -  Foot Form Completion - Done Done    [place]  reports that he has never smoked. He has never used smokeless tobacco. He reports that he does not drink alcohol and does not use drugs.     Assessment & Plan:    Type 2 diabetes mellitus without complication, without long-term current use of insulin (HCC) - Plan: POCT glycosylated hemoglobin (Hb A1C), Ertugliflozin-metFORMIN HCl 2.08-998 MG TABS  Obesity (BMI 30-39.9)  Hypertension associated with diabetes (HCC)  Hyperlipidemia associated with type 2 diabetes mellitus (HCC)  Encounter for long-term (current) use of medications  Rx changes:  segluromet Education: Reviewed 'ABCs' of diabetes management (respective goals in parentheses):  A1C (<7), blood pressure (<130/80), and cholesterol (LDL <100). Compliance at present is estimated to be fair. Efforts to improve compliance (if necessary) will be directed at increased exercise. Follow up: 4 months A discount card was given to him for this medication.  Explained that he needs to stop taking his metformin.  If he has difficulties with the medication he will let me know.  He will continue on his other medications.  Discussed the fact that he needs to make some dietary and exercise changes with his new work schedule.  Recheck here 4 months.

## 2020-11-06 NOTE — Patient Instructions (Signed)
Stop your metformin and start taking the new drug and if he had any troubles with this new drug let me know

## 2020-11-10 ENCOUNTER — Other Ambulatory Visit: Payer: Self-pay | Admitting: Family Medicine

## 2020-11-10 ENCOUNTER — Telehealth: Payer: Self-pay

## 2020-11-10 DIAGNOSIS — E119 Type 2 diabetes mellitus without complications: Secondary | ICD-10-CM

## 2020-11-10 NOTE — Telephone Encounter (Signed)
P.A. STEGLATRO 

## 2020-11-12 ENCOUNTER — Encounter: Payer: Self-pay | Admitting: Family Medicine

## 2020-11-17 NOTE — Telephone Encounter (Signed)
Activated discount card and called pharmacy they couldn't get it to go thru, called rep and had them call the pharmacy still couldn't get it to go thru even though this discount card is supposed to work whether insurance pays or not.  Samples given to hold pt until this is resolved or pt is switched to another medication.  Called rep and he said call back on Monday if can't get it to go thru can possible try Pt Assistance.

## 2020-11-18 NOTE — Telephone Encounter (Signed)
P.A. denied, pt needs trial of both Xigduo XR and Synjardy, do you want to switch?

## 2020-12-05 NOTE — Telephone Encounter (Signed)
I called [employer] t# [phone], and finally was able to get a representative on the phone to assist with a SGLT2 and [place] and [place] are both covered and both have discount cards so which would you prefer to switch ?

## 2020-12-07 MED ORDER — DAPAGLIFLOZIN PROPANEDIOL 5 MG PO TABS: 5.0000 mg | ORAL_TABLET | Freq: Every day | ORAL | 3 refills | Status: DC

## 2020-12-07 NOTE — Telephone Encounter (Signed)
Sent Farxiga in, activated discount card and called [facility] they have not been able to process it yet but did receive it.  Called pt and left message

## 2020-12-11 ENCOUNTER — Telehealth: Payer: Self-pay

## 2020-12-11 NOTE — Telephone Encounter (Signed)
Pt switched to [facility] $0 co pay with discount card. Pt informed

## 2020-12-11 NOTE — Telephone Encounter (Signed)
Called [facility] and went thru for $0, pt informed

## 2021-01-03 ENCOUNTER — Other Ambulatory Visit: Payer: Self-pay | Admitting: Family Medicine

## 2021-01-03 DIAGNOSIS — E1159 Type 2 diabetes mellitus with other circulatory complications: Secondary | ICD-10-CM

## 2021-01-03 DIAGNOSIS — I152 Hypertension secondary to endocrine disorders: Secondary | ICD-10-CM

## 2021-01-11 ENCOUNTER — Other Ambulatory Visit: Payer: Self-pay | Admitting: Family Medicine

## 2021-01-12 ENCOUNTER — Telehealth: Payer: Self-pay | Admitting: Family Medicine

## 2021-01-12 NOTE — Telephone Encounter (Signed)
Pt left voicemail that pharmacy sent refill request for metformin but had not received respose

## 2021-01-16 ENCOUNTER — Telehealth: Payer: Self-pay

## 2021-01-16 MED ORDER — METFORMIN HCL 1000 MG PO TABS: 1000.0000 mg | ORAL_TABLET | Freq: Two times a day (BID) | ORAL | 1 refills | Status: DC

## 2021-01-16 NOTE — Telephone Encounter (Signed)
Pt was advised Kh 

## 2021-01-16 NOTE — Telephone Encounter (Signed)
Pt. Called stating there was some confusion over his diabetic medications. He needs a refill on his Metformin but it is not listed in his current med list. He said you tried to switch him to a combination med with metformin and that was denied by the ins. So they were supposed to be sent in separately. He got Marcelline Deist but now they are giving him trouble filling his Metformin.

## 2021-02-19 ENCOUNTER — Ambulatory Visit: Payer: BC Managed Care – PPO | Admitting: Medical

## 2021-02-19 ENCOUNTER — Other Ambulatory Visit: Payer: Self-pay

## 2021-02-19 VITALS — BP 120/70 | HR 78 | Temp 97.1°F | Wt 233.4 lb

## 2021-02-19 DIAGNOSIS — L600 Ingrowing nail: Secondary | ICD-10-CM | POA: Diagnosis not present

## 2021-02-19 DIAGNOSIS — E119 Type 2 diabetes mellitus without complications: Secondary | ICD-10-CM

## 2021-02-19 NOTE — Progress Notes (Signed)
Subjective:  Marcus Holland is a 55 y.o. male who presents for Chief Complaint  Patient presents with   ingrown toe nail    Left big toe x 1 week. Redness and painful     Here for ingrown toenail. Has some pain, mild swelling, mild redness, some drainage of pus, minimal.  Left great toe.   Has had this before.  Normally he is able to tease out the toenail, but this time its ingrowing too far for him to take care of it.  Has had medical procedure once for the same.   Otherwise in normal state of health.  No fever, no body aches or chills.   No other aggravating or relieving factors.    No other c/o.  The following portions of the patient's history were reviewed and updated as appropriate: allergies, current medications, past family history, past medical history, past social history, past surgical history and problem list.  ROS Otherwise as in subjective above  Objective: BP 120/70   Pulse 78   Temp (!) 97.1 F (36.2 C)   Wt 233 lb 6.4 oz (105.9 kg)   BMI 30.58 kg/m   General appearance: alert, no distress, well developed, well nourished Left great toenail somewhat yellow and thickened in general, ingrown lateral nail of great toe cutting into soft tissue with mild localized tissue redness and swelling, mild tender to palpation.  Rest of left toenails somewhat yellowish.  Right toenails similar with mild thickening of great toenail and somewhat yellowish coloration of nails Feet and toes with good pulses and normal cap refill, normal strength and sensation   Assessment: Encounter Diagnoses  Name Primary?   Ingrown toenail of left foot Yes   Type 2 diabetes mellitus without complication, without long-term current use of insulin (HCC)      Plan: Discussed options for therapy.  Declined podiatry referral.  Discussed procedure, risks/benefits.  He gives consent.  Advised partial nail removal to resolve ingrowing nail  Procedure: Cleaned and prepped in usual sterile fashion, used 2cc  of 1% lidocaine without epinephrine for a digital block of the left great toe, used a hemostat and scissors to remove the lateral fifth of the nail, irrigated with high-pressure saline, covered with sterile bandage. Patient tolerated procedure well, less than 2 cc blood loss.  Discussed aftercare.  Keep wound clean and dry.  Discussed proper nail cutting for fingers and toes.  Discussed signs of infection, and they will call if worse or not improving.    Marcus Holland was seen today for ingrown toe nail.  Diagnoses and all orders for this visit:  Ingrown toenail of left foot  Type 2 diabetes mellitus without complication, without long-term current use of insulin (HCC)   Follow up: prn

## 2021-02-19 NOTE — Patient Instructions (Signed)
Ingrown Toenail An ingrown toenail occurs when the corner or sides of a toenail grow into the surrounding skin. This causes discomfort and pain. The big toe is most commonly affected, but any of the toes can be affected. If an ingrown toenail is nottreated, it can become infected. What are the causes? This condition may be caused by: Wearing shoes that are too small or tight. An injury, such as stubbing your toe or having your toe stepped on. Improper cutting or care of your toenails. Having nail or foot abnormalities that were present from birth (congenital abnormalities), such as having a nail that is too big for your toe. What increases the risk? The following factors may make you more likely to develop ingrown toenails: Age. Nails tend to get thicker with age, so ingrown nails are more common among older people. Cutting your toenails incorrectly, such as cutting them very short or cutting them unevenly. An ingrown toenail is more likely to get infected if you have: Diabetes. Blood flow (circulation) problems. What are the signs or symptoms? Symptoms of an ingrown toenail may include: Pain, soreness, or tenderness. Redness. Swelling. Hardening of the skin that surrounds the toenail. Signs that an ingrown toenail may be infected include: Fluid or pus. Symptoms that get worse instead of better. How is this diagnosed? An ingrown toenail may be diagnosed based on your medical history, your symptoms, and a physical exam. If you have fluid or blood coming from your toenail, a sample may be collected to test for the specific type of bacteriathat is causing the infection. How is this treated? Treatment depends on how severe your ingrown toenail is. You may be able to care for your toenail at home. If you have an infection, you may be prescribed antibiotic medicines. If you have fluid or pus draining from your toenail, your health care provider may drain it. If you have trouble walking, you  may be given crutches to use. If you have a severe or infected ingrown toenail, you may need a procedure to remove part or all of the nail. Follow these instructions at home: Foot care  Do not pick at your toenail or try to remove it yourself. Soak your foot in warm, soapy water. Do this for 20 minutes, 3 times a day, or as often as told by your health care provider. This helps to keep your toe clean and keep your skin soft. Wear shoes that fit well and are not too tight. Your health care provider may recommend that you wear open-toed shoes while you heal. Trim your toenails regularly and carefully. Cut your toenails straight across to prevent injury to the skin at the corners of the toenail. Do not cut your nails in a curved shape. Keep your feet clean and dry to help prevent infection.  Medicines Take over-the-counter and prescription medicines only as told by your health care provider. If you were prescribed an antibiotic, take it as told by your health care provider. Do not stop taking the antibiotic even if you start to feel better. Activity Return to your normal activities as told by your health care provider. Ask your health care provider what activities are safe for you. Avoid activities that cause pain. General instructions If your health care provider told you to use crutches to help you move around, use them as instructed. Keep all follow-up visits as told by your health care provider. This is important. Contact a health care provider if: You have more redness, swelling, pain, or   other symptoms that do not improve with treatment. You have fluid, blood, or pus coming from your toenail. Get help right away if: You have a red streak on your skin that starts at your foot and spreads up your leg. You have a fever. Summary An ingrown toenail occurs when the corner or sides of a toenail grow into the surrounding skin. This causes discomfort and pain. The big toe is most commonly  affected, but any of the toes can be affected. If an ingrown toenail is not treated, it can become infected. Fluid or pus draining from your toenail is a sign of infection. Your health care provider may need to drain it. You may be given antibiotics to treat the infection. Trimming your toenails regularly and properly can help you prevent an ingrown toenail. This information is not intended to replace advice given to you by your health care provider. Make sure you discuss any questions you have with your healthcare provider. Document Revised: 07/31/2018 Document Reviewed: 12/25/2016 Elsevier Patient Education  2021 Elsevier Inc.  

## 2021-03-14 ENCOUNTER — Ambulatory Visit (INDEPENDENT_AMBULATORY_CARE_PROVIDER_SITE_OTHER): Payer: BC Managed Care – PPO | Admitting: Family Medicine

## 2021-03-14 ENCOUNTER — Other Ambulatory Visit: Payer: Self-pay

## 2021-03-14 ENCOUNTER — Encounter: Payer: Self-pay | Admitting: Family Medicine

## 2021-03-14 VITALS — BP 136/86 | HR 66 | Temp 96.1°F | Ht 72.0 in | Wt 231.2 lb

## 2021-03-14 DIAGNOSIS — E119 Type 2 diabetes mellitus without complications: Secondary | ICD-10-CM | POA: Diagnosis not present

## 2021-03-14 DIAGNOSIS — E1169 Type 2 diabetes mellitus with other specified complication: Secondary | ICD-10-CM | POA: Diagnosis not present

## 2021-03-14 DIAGNOSIS — E785 Hyperlipidemia, unspecified: Secondary | ICD-10-CM | POA: Diagnosis not present

## 2021-03-14 DIAGNOSIS — I152 Hypertension secondary to endocrine disorders: Secondary | ICD-10-CM

## 2021-03-14 DIAGNOSIS — Z2821 Immunization not carried out because of patient refusal: Secondary | ICD-10-CM

## 2021-03-14 DIAGNOSIS — Z8616 Personal history of COVID-19: Secondary | ICD-10-CM

## 2021-03-14 DIAGNOSIS — Z0000 Encounter for general adult medical examination without abnormal findings: Secondary | ICD-10-CM | POA: Diagnosis not present

## 2021-03-14 DIAGNOSIS — E669 Obesity, unspecified: Secondary | ICD-10-CM | POA: Diagnosis not present

## 2021-03-14 DIAGNOSIS — E1159 Type 2 diabetes mellitus with other circulatory complications: Secondary | ICD-10-CM

## 2021-03-14 LAB — POCT GLYCOSYLATED HEMOGLOBIN (HGB A1C): Hemoglobin A1C: 7 % — AB (ref 4.0–5.6)

## 2021-03-14 NOTE — Progress Notes (Signed)
   Subjective:    Patient ID: Marcus Holland, male    DOB: Jan 01, 1966, 55 y.o.   MRN: [id number]  HPI He is here for complete examination.  Presently he is using Actos, metformin and most recently [place].  Review of the record indicates that he has lost some weight.  His job does keep him physically active.  He continues on lisinopril/HCTZ as well as Hytrin and amlodipine.  Atorvastatin is causing no difficulty.  He does not smoke or drink.  His job keeps him physically active.  He has not been going well.  He did have blood work in March.   Review of Systems  All other systems reviewed and are negative.     Objective:   Physical Exam Alert and in no distress. Tympanic membranes and canals are normal. Pharyngeal area is normal. Neck is supple without adenopathy or thyromegaly. Cardiac exam shows a regular sinus rhythm without murmurs or gallops. Lungs are clear to auscultation.  Abdominal exam shows no hepatosplenomegaly, masses or tenderness. Hemoglobin A1c is 7.0.  EKG read by me shows a sinus rhythm with other parameters being totally normal except for flat T waves. Foot exam is recorded and shows good pulses and sensation.       Assessment & Plan:  Routine general medical examination at a health care facility  Type 2 diabetes mellitus without complication, without long-term current use of insulin (HCC) - Plan: POCT glycosylated hemoglobin (Hb A1C), EKG 12-Lead  Hyperlipidemia associated with type 2 diabetes mellitus (HCC) - Plan: POCT glycosylated hemoglobin (Hb A1C)  Obesity (BMI 30-39.9)  Hypertension associated with diabetes (HCC) - Plan: EKG 12-Lead  Immunization refused  History of COVID-19 He is refused immunizations in the past and therefore I did not talk to him about it again.  I complemented him on his weight loss.  He will continue his present medication regimen and I will refer him to potentially get involved in a diabetes/weight loss program.  Otherwise recheck here in  6 months.

## 2021-04-12 ENCOUNTER — Other Ambulatory Visit: Payer: Self-pay | Admitting: Medical

## 2021-04-12 DIAGNOSIS — E1159 Type 2 diabetes mellitus with other circulatory complications: Secondary | ICD-10-CM

## 2021-04-18 ENCOUNTER — Other Ambulatory Visit: Payer: Self-pay | Admitting: Family Medicine

## 2021-05-10 ENCOUNTER — Telehealth: Payer: Self-pay

## 2021-05-10 NOTE — Telephone Encounter (Signed)
Pt left message insurance changed January & now [facility] and would like to see if will cover 90 days Farxiga, left message for pt

## 2021-05-11 ENCOUNTER — Telehealth: Payer: Self-pay

## 2021-05-11 MED ORDER — DAPAGLIFLOZIN PROPANEDIOL 5 MG PO TABS: 5.0000 mg | ORAL_TABLET | Freq: Every day | ORAL | 1 refills | Status: DC

## 2021-05-11 NOTE — Telephone Encounter (Signed)
Done

## 2021-05-11 NOTE — Telephone Encounter (Signed)
P.A. FARXIGA 

## 2021-05-21 NOTE — Telephone Encounter (Signed)
P.A. approved til 05/11/22 called pt and informed, he is going to check the price on the mail order, states insurance requires anything that he takes more than 90 days to go to mail order.  Advised he can't use discount card on mail order.

## 2021-05-27 ENCOUNTER — Encounter: Payer: Self-pay | Admitting: Family Medicine

## 2021-05-28 ENCOUNTER — Other Ambulatory Visit: Payer: Self-pay

## 2021-05-28 DIAGNOSIS — E785 Hyperlipidemia, unspecified: Secondary | ICD-10-CM

## 2021-05-28 DIAGNOSIS — E119 Type 2 diabetes mellitus without complications: Secondary | ICD-10-CM

## 2021-05-28 DIAGNOSIS — E1159 Type 2 diabetes mellitus with other circulatory complications: Secondary | ICD-10-CM

## 2021-05-28 DIAGNOSIS — E1169 Type 2 diabetes mellitus with other specified complication: Secondary | ICD-10-CM

## 2021-05-28 MED ORDER — ATORVASTATIN CALCIUM 20 MG PO TABS: 20.0000 mg | ORAL_TABLET | Freq: Every day | ORAL | 1 refills | Status: DC

## 2021-05-28 MED ORDER — PIOGLITAZONE HCL 30 MG PO TABS: 30.0000 mg | ORAL_TABLET | Freq: Every day | ORAL | 1 refills | Status: DC

## 2021-05-28 MED ORDER — AMLODIPINE BESYLATE 10 MG PO TABS: 10.0000 mg | ORAL_TABLET | Freq: Every day | ORAL | 1 refills | Status: DC

## 2021-05-28 MED ORDER — TERAZOSIN HCL 2 MG PO CAPS: ORAL_CAPSULE | ORAL | 1 refills | Status: DC

## 2021-05-28 MED ORDER — LISINOPRIL-HYDROCHLOROTHIAZIDE 20-12.5 MG PO TABS: ORAL_TABLET | ORAL | 1 refills | Status: DC

## 2021-05-28 MED ORDER — METFORMIN HCL 1000 MG PO TABS: 1000.0000 mg | ORAL_TABLET | Freq: Two times a day (BID) | ORAL | 1 refills | Status: DC

## 2021-09-17 NOTE — Progress Notes (Unsigned)
  Subjective:    Patient ID: Marcus Holland, male    DOB: [birth year], 56 y.o.   MRN: [id number]  Marcus Holland is a 56 y.o. male who presents for follow-up of Type 2 diabetes mellitus.  Home blood sugar records: {diabetes glucometry results:16657} Current symptoms/problems include {Symptoms; diabetes:14075} and have been {Desc; course:15616}. Daily foot checks:   Any foot concerns: *** Exercise: {types:19826} Diet: The following portions of the patient's history were reviewed and updated as appropriate: allergies, current medications, past medical history, past social history and problem list.  ROS as in subjective above.     Objective:    Physical Exam Alert and in no distress otherwise not examined.  There were no vitals taken for this visit.  Lab Review    Latest Ref Rng & Units 03/14/2021    8:44 AM 11/06/2020   10:39 AM 07/18/2020    9:59 AM 07/18/2020    9:30 AM 07/18/2020    9:25 AM  Diabetic Labs  HbA1c 4.0 - 5.6 % 7.0   8.5     7.5    Microalbumin mg/L   <5.0      Micro/Creat Ratio    <8.9      Chol 100 - 199 mg/dL    137     HDL >39 mg/dL    40     Calc LDL 0 - 99 mg/dL    79     Triglycerides 0 - 149 mg/dL    92     Creatinine 0.76 - 1.27 mg/dL    0.96         03/14/2021    8:26 AM 02/19/2021    9:40 AM 11/06/2020   10:40 AM 07/18/2020    8:30 AM 04/24/2020    9:01 AM  BP/Weight  Systolic BP [id number]   Diastolic BP 86 70 82 82   Wt. (Lbs) 231.2 233.4 244.2 237.6 230  BMI 31.36 kg/m2 30.58 kg/m2 32 kg/m2 31.35 kg/m2 30.34 kg/m2      Latest Ref Rng & Units 03/14/2021    8:30 AM 10/31/2020   12:00 AM  Foot/eye exam completion dates  Eye Exam No Retinopathy  No Retinopathy       Foot Form Completion  Done      This result is from an external source.    Marcus Holland  reports that he has never smoked. He has never used smokeless tobacco. He reports that he does not drink alcohol and does not use drugs.     Assessment & Plan:    No diagnosis found.  Rx changes:  {none:33079} Education: Reviewed 'ABCs' of diabetes management (respective goals in parentheses):  A1C (<7), blood pressure (<130/80), and cholesterol (LDL <100). Compliance at present is estimated to be {good/fair/poor:33178}. Efforts to improve compliance (if necessary) will be directed at {compliance:16716}. Follow up: {NUMBERS; 0-10:33138} {time:11}

## 2021-09-18 ENCOUNTER — Ambulatory Visit: Payer: BC Managed Care – PPO | Admitting: Family Medicine

## 2021-09-18 VITALS — BP 124/80 | HR 83 | Temp 96.8°F | Wt 236.4 lb

## 2021-09-18 DIAGNOSIS — E119 Type 2 diabetes mellitus without complications: Secondary | ICD-10-CM | POA: Diagnosis not present

## 2021-09-18 DIAGNOSIS — Z79899 Other long term (current) drug therapy: Secondary | ICD-10-CM | POA: Diagnosis not present

## 2021-09-18 DIAGNOSIS — K219 Gastro-esophageal reflux disease without esophagitis: Secondary | ICD-10-CM

## 2021-09-18 DIAGNOSIS — E1169 Type 2 diabetes mellitus with other specified complication: Secondary | ICD-10-CM | POA: Diagnosis not present

## 2021-09-18 DIAGNOSIS — E785 Hyperlipidemia, unspecified: Secondary | ICD-10-CM

## 2021-09-18 DIAGNOSIS — I152 Hypertension secondary to endocrine disorders: Secondary | ICD-10-CM | POA: Diagnosis not present

## 2021-09-18 DIAGNOSIS — E1159 Type 2 diabetes mellitus with other circulatory complications: Secondary | ICD-10-CM

## 2021-09-18 DIAGNOSIS — E669 Obesity, unspecified: Secondary | ICD-10-CM

## 2021-09-18 DIAGNOSIS — Z566 Other physical and mental strain related to work: Secondary | ICD-10-CM

## 2021-09-18 LAB — POCT GLYCOSYLATED HEMOGLOBIN (HGB A1C): Hemoglobin A1C: 7.3 % — AB (ref 4.0–5.6)

## 2021-09-18 LAB — POCT UA - MICROALBUMIN
Albumin/Creatinine Ratio, Urine, POC: <21
Creatinine, POC: 23.8 mg/dL
Microalbumin Ur, POC: <5 mg/L

## 2021-09-18 MED ORDER — ATORVASTATIN CALCIUM 20 MG PO TABS: 20.0000 mg | ORAL_TABLET | Freq: Every day | ORAL | 3 refills | Status: DC

## 2021-09-18 MED ORDER — METFORMIN HCL 1000 MG PO TABS: 1000.0000 mg | ORAL_TABLET | Freq: Two times a day (BID) | ORAL | 1 refills | Status: DC

## 2021-09-18 MED ORDER — LISINOPRIL-HYDROCHLOROTHIAZIDE 20-12.5 MG PO TABS: ORAL_TABLET | ORAL | 3 refills | Status: DC

## 2021-09-18 MED ORDER — AMLODIPINE BESYLATE 10 MG PO TABS: 10.0000 mg | ORAL_TABLET | Freq: Every day | ORAL | 3 refills | Status: DC

## 2021-09-18 MED ORDER — PIOGLITAZONE HCL 30 MG PO TABS: 30.0000 mg | ORAL_TABLET | Freq: Every day | ORAL | 1 refills | Status: DC

## 2021-09-18 MED ORDER — TERAZOSIN HCL 2 MG PO CAPS: ORAL_CAPSULE | ORAL | 3 refills | Status: DC

## 2021-09-19 LAB — CBC WITH DIFFERENTIAL/PLATELET
Basophils Absolute: 0 10*3/uL (ref 0.0–0.2)
Basos: 1 %
EOS (ABSOLUTE): 0.1 10*3/uL (ref 0.0–0.4)
Eos: 2 %
Hematocrit: 41.9 % (ref 37.5–51.0)
Hemoglobin: 14.1 g/dL (ref 13.0–17.7)
Immature Grans (Abs): 0 10*3/uL (ref 0.0–0.1)
Immature Granulocytes: 0 %
Lymphocytes Absolute: 1.6 10*3/uL (ref 0.7–3.1)
Lymphs: 26 %
MCH: 29.7 pg (ref 26.6–33.0)
MCHC: 33.7 g/dL (ref 31.5–35.7)
MCV: 88 fL (ref 79–97)
Monocytes Absolute: 0.4 10*3/uL (ref 0.1–0.9)
Monocytes: 6 %
Neutrophils Absolute: 4 10*3/uL (ref 1.4–7.0)
Neutrophils: 65 %
Platelets: 278 10*3/uL (ref 150–450)
RBC: 4.74 x10E6/uL (ref 4.14–5.80)
RDW: 12.8 % (ref 11.6–15.4)
WBC: 6.1 10*3/uL (ref 3.4–10.8)

## 2021-09-19 LAB — COMPREHENSIVE METABOLIC PANEL
ALT: 23 IU/L (ref 0–44)
AST: 17 IU/L (ref 0–40)
Albumin/Globulin Ratio: 2.3 — ABNORMAL HIGH (ref 1.2–2.2)
Albumin: 4.8 g/dL (ref 3.8–4.9)
Alkaline Phosphatase: 104 IU/L (ref 44–121)
BUN/Creatinine Ratio: 15 (ref 9–20)
BUN: 14 mg/dL (ref 6–24)
Bilirubin Total: 0.5 mg/dL (ref 0.0–1.2)
CO2: 24 mmol/L (ref 20–29)
Calcium: 10.2 mg/dL (ref 8.7–10.2)
Chloride: 103 mmol/L (ref 96–106)
Creatinine, Ser: 0.93 mg/dL (ref 0.76–1.27)
Globulin, Total: 2.1 g/dL (ref 1.5–4.5)
Glucose: 162 mg/dL — ABNORMAL HIGH (ref 70–99)
Potassium: 4.2 mmol/L (ref 3.5–5.2)
Sodium: 144 mmol/L (ref 134–144)
Total Protein: 6.9 g/dL (ref 6.0–8.5)
eGFR: 95 mL/min/{1.73_m2} (ref >59)

## 2021-09-19 LAB — LIPID PANEL
Chol/HDL Ratio: 3.2 ratio (ref 0.0–5.0)
Cholesterol, Total: 117 mg/dL (ref 100–199)
HDL: 37 mg/dL — ABNORMAL LOW (ref >39)
LDL Chol Calc (NIH): 60 mg/dL (ref 0–99)
Triglycerides: 109 mg/dL (ref 0–149)
VLDL Cholesterol Cal: 20 mg/dL (ref 5–40)

## 2021-10-11 ENCOUNTER — Telehealth: Payer: Self-pay | Admitting: Family Medicine

## 2021-10-11 NOTE — Telephone Encounter (Signed)
I called [facility] and pt has never gotten his Marcelline Deist, I have already gotten the P.A. approved earlier this year and informed pt.  But issue is that cost is $215 for 30 days and $614 for 90 days.  Called [employer] was on hold for 30 minutes then disconnected.  Trying to find out why is so expensive, if there is less expensive option, and why pt can't use local pharmacy & use discount card.

## 2021-10-11 NOTE — Telephone Encounter (Signed)
Sent pt mychart message

## 2021-10-25 ENCOUNTER — Encounter: Payer: Self-pay | Admitting: Family Medicine

## 2021-12-26 ENCOUNTER — Encounter: Payer: Self-pay | Admitting: Internal Medicine

## 2021-12-31 ENCOUNTER — Ambulatory Visit: Payer: BC Managed Care – PPO | Admitting: Family Medicine

## 2022-01-22 ENCOUNTER — Ambulatory Visit: Payer: BC Managed Care – PPO | Admitting: Family Medicine

## 2022-01-22 VITALS — BP 118/80 | HR 96 | Temp 96.8°F | Wt 245.4 lb

## 2022-01-22 DIAGNOSIS — E119 Type 2 diabetes mellitus without complications: Secondary | ICD-10-CM | POA: Diagnosis not present

## 2022-01-22 DIAGNOSIS — E669 Obesity, unspecified: Secondary | ICD-10-CM

## 2022-01-22 DIAGNOSIS — E785 Hyperlipidemia, unspecified: Secondary | ICD-10-CM

## 2022-01-22 DIAGNOSIS — E1159 Type 2 diabetes mellitus with other circulatory complications: Secondary | ICD-10-CM

## 2022-01-22 DIAGNOSIS — Z79899 Other long term (current) drug therapy: Secondary | ICD-10-CM

## 2022-01-22 DIAGNOSIS — Z2821 Immunization not carried out because of patient refusal: Secondary | ICD-10-CM

## 2022-01-22 DIAGNOSIS — I152 Hypertension secondary to endocrine disorders: Secondary | ICD-10-CM

## 2022-01-22 DIAGNOSIS — E1169 Type 2 diabetes mellitus with other specified complication: Secondary | ICD-10-CM | POA: Diagnosis not present

## 2022-01-22 LAB — POCT GLYCOSYLATED HEMOGLOBIN (HGB A1C): Hemoglobin A1C: 8.3 % — AB (ref 4.0–5.6)

## 2022-01-22 MED ORDER — TIRZEPATIDE 2.5 MG/0.5ML ~~LOC~~ SOAJ: 2.5000 mg | SUBCUTANEOUS | 0 refills | Status: DC

## 2022-01-22 NOTE — Progress Notes (Signed)
Subjective:    Patient ID: Marcus Holland, male    DOB: August 19, 1965, 56 y.o.   MRN: [id number]  Marcus Holland is a 56 y.o. male who presents for follow-up of Type 2 diabetes mellitus.  Patient is checking home blood sugars.   Home blood sugar records: BGs have been labile ranging between 140 and 190 How often is blood sugars being checked: once  a week Current symptoms/problems include none and have been stable. Daily foot checks: yes   Any foot concerns: none  Last eye exam: 10/31/2020  Exercise:  staying active at least 20 minutes a day  He was given [place] in the past however the cost is excessive and he has not been taking it.  He continues on metformin and Actos.  He also is taking his amlodipine and lisinopril/HCTZ.  Also was taking Hytrin. The following portions of the patient's history were reviewed and updated as appropriate: allergies, current medications, past medical history, past social history and problem list.  ROS as in subjective above.     Objective:    Physical Exam Alert and in no distress otherwise not examined. Hemoglobin A1c 8.3 Blood pressure 118/80, pulse 96, temperature (!) 96.8 F (36 C), weight 245 lb 6.4 oz (111.3 kg), SpO2 98 %.  Lab Review    Latest Ref Rng & Units 01/22/2022   12:46 PM 09/18/2021   11:54 AM 09/18/2021   10:34 AM 09/18/2021   10:21 AM 03/14/2021    8:44 AM  Diabetic Labs  HbA1c 4.0 - 5.6 % 8.3   7.3   7.0   Microalbumin mg/L  <5.0      Micro/Creat Ratio   <21.0      Chol 100 - 199 mg/dL    117    HDL >39 mg/dL    37    Calc LDL 0 - 99 mg/dL    60    Triglycerides 0 - 149 mg/dL    109    Creatinine 0.76 - 1.27 mg/dL    0.93        01/22/2022   11:31 AM 09/18/2021    9:45 AM 03/14/2021    8:26 AM 02/19/2021    9:40 AM 11/06/2020   10:40 AM  BP/Weight  Systolic BP 734 193 790 240 973  Diastolic BP 80 80 86 70 82  Wt. (Lbs) 245.4 236.4 231.2 233.4 244.2  BMI 33.28 kg/m2 32.06 kg/m2 31.36 kg/m2 30.58 kg/m2 32 kg/m2      Latest Ref  Rng & Units 03/14/2021    8:30 AM 10/31/2020   12:00 AM  Foot/eye exam completion dates  Eye Exam No Retinopathy  No Retinopathy      Foot Form Completion  Done      This result is from an external source.    Hector  reports that he has never smoked. He has never used smokeless tobacco. He reports that he does not drink alcohol and does not use drugs.     Assessment & Plan:    Type 2 diabetes mellitus without complication, without long-term current use of insulin (HCC) - Plan: POCT glycosylated hemoglobin (Hb A1C), tirzepatide (MOUNJARO) 2.5 MG/0.5ML Pen  Hypertension associated with diabetes ([place])  Hyperlipidemia associated with type 2 diabetes mellitus ([place])  Obesity (BMI 30-39.9)  Encounter for long-term (current) use of medications  Immunization refused I will place him on Mounjaro and hopefully get this covered.  He is to let me know whether his insurance will cover this and  if not what the alternative will be.  Discussed possible side effects of this medication with him otherwise I will see him again in 4 months and also do a complete exam.

## 2022-01-22 NOTE — Patient Instructions (Signed)

## 2022-02-11 ENCOUNTER — Encounter: Payer: Self-pay | Admitting: Internal Medicine

## 2022-03-01 ENCOUNTER — Encounter: Payer: Self-pay | Admitting: Family Medicine

## 2022-03-01 DIAGNOSIS — E119 Type 2 diabetes mellitus without complications: Secondary | ICD-10-CM

## 2022-03-01 MED ORDER — TIRZEPATIDE 2.5 MG/0.5ML ~~LOC~~ SOAJ: 2.5000 mg | SUBCUTANEOUS | 0 refills | Status: DC

## 2022-04-08 ENCOUNTER — Encounter: Payer: BC Managed Care – PPO | Admitting: Family Medicine

## 2022-05-23 DIAGNOSIS — E119 Type 2 diabetes mellitus without complications: Secondary | ICD-10-CM | POA: Diagnosis not present

## 2022-05-29 ENCOUNTER — Encounter: Payer: Self-pay | Admitting: Family Medicine

## 2022-05-29 ENCOUNTER — Ambulatory Visit: Payer: BC Managed Care – PPO | Admitting: Family Medicine

## 2022-05-29 VITALS — BP 132/88 | HR 65 | Temp 98.2°F | Resp 16 | Ht 73.0 in | Wt 243.0 lb

## 2022-05-29 DIAGNOSIS — Z0000 Encounter for general adult medical examination without abnormal findings: Secondary | ICD-10-CM

## 2022-05-29 DIAGNOSIS — E785 Hyperlipidemia, unspecified: Secondary | ICD-10-CM

## 2022-05-29 DIAGNOSIS — E1136 Type 2 diabetes mellitus with diabetic cataract: Secondary | ICD-10-CM

## 2022-05-29 DIAGNOSIS — I152 Hypertension secondary to endocrine disorders: Secondary | ICD-10-CM

## 2022-05-29 DIAGNOSIS — K219 Gastro-esophageal reflux disease without esophagitis: Secondary | ICD-10-CM | POA: Diagnosis not present

## 2022-05-29 DIAGNOSIS — E119 Type 2 diabetes mellitus without complications: Secondary | ICD-10-CM

## 2022-05-29 DIAGNOSIS — E1159 Type 2 diabetes mellitus with other circulatory complications: Secondary | ICD-10-CM | POA: Diagnosis not present

## 2022-05-29 DIAGNOSIS — Z8616 Personal history of COVID-19: Secondary | ICD-10-CM

## 2022-05-29 DIAGNOSIS — I4891 Unspecified atrial fibrillation: Secondary | ICD-10-CM | POA: Diagnosis not present

## 2022-05-29 DIAGNOSIS — E1169 Type 2 diabetes mellitus with other specified complication: Secondary | ICD-10-CM

## 2022-05-29 DIAGNOSIS — E669 Obesity, unspecified: Secondary | ICD-10-CM

## 2022-05-29 LAB — POCT URINALYSIS DIP (PROADVANTAGE DEVICE)
Bilirubin, UA: NEGATIVE
Blood, UA: NEGATIVE
Glucose, UA: 250 mg/dL — AB
Ketones, POC UA: NEGATIVE mg/dL
Leukocytes, UA: NEGATIVE
Nitrite, UA: NEGATIVE
Protein Ur, POC: NEGATIVE mg/dL
Specific Gravity, Urine: 1.005
Urobilinogen, Ur: 0.2
pH, UA: 6.5 (ref 5.0–8.0)

## 2022-05-29 LAB — POCT GLYCOSYLATED HEMOGLOBIN (HGB A1C): Hemoglobin A1C: 10.3 % — AB (ref 4.0–5.6)

## 2022-05-29 MED ORDER — TERAZOSIN HCL 2 MG PO CAPS: ORAL_CAPSULE | ORAL | 3 refills | Status: DC

## 2022-05-29 MED ORDER — RIVAROXABAN 20 MG PO TABS: 20.0000 mg | ORAL_TABLET | Freq: Every day | ORAL | 1 refills | Status: DC

## 2022-05-29 MED ORDER — DAPAGLIFLOZIN PROPANEDIOL 10 MG PO TABS: 10.0000 mg | ORAL_TABLET | Freq: Every day | ORAL | 5 refills | Status: DC

## 2022-05-29 MED ORDER — METFORMIN HCL 1000 MG PO TABS: 1000.0000 mg | ORAL_TABLET | Freq: Two times a day (BID) | ORAL | 1 refills | Status: DC

## 2022-05-29 MED ORDER — PIOGLITAZONE HCL 30 MG PO TABS: 30.0000 mg | ORAL_TABLET | Freq: Every day | ORAL | 1 refills | Status: DC

## 2022-05-29 MED ORDER — AMLODIPINE BESYLATE 10 MG PO TABS: 10.0000 mg | ORAL_TABLET | Freq: Every day | ORAL | 3 refills | Status: DC

## 2022-05-29 MED ORDER — ATORVASTATIN CALCIUM 20 MG PO TABS: 20.0000 mg | ORAL_TABLET | Freq: Every day | ORAL | 3 refills | Status: DC

## 2022-05-29 MED ORDER — LISINOPRIL-HYDROCHLOROTHIAZIDE 20-12.5 MG PO TABS: ORAL_TABLET | ORAL | 3 refills | Status: DC

## 2022-05-29 NOTE — Progress Notes (Signed)
Complete physical exam  Patient: Marcus Holland   DOB: 04-30-1965   57 y.o. Male  MRN: [id number]  Subjective:   He is here for complete examination.  He recently saw his ophthalmologist and does have evidence of cataract and another eye problem did not require surgical intervention.  He was seen in October for his diabetes and placed on Mounjaro but the cost was prohibitive.  He also had potential problems with [place] being expensive.  Presently he is taking metformin and pioglitazone.  He does not smoke or drink and his physical activity is limited.  He is not interested in any immunizations.  Continues on lisinopril/HCTZ, Hytrin and amlodipine for his blood pressure.  He has no particular concerns or complaints specifically chest pain, shortness of breath, irregular heart rate.  He continues on atorvastatin.  Family and social history as well as health maintenance and immunizations was reviewed. Chief Complaint  Patient presents with   Annual Exam    Fasting. No additional concerns.     Most recent fall risk assessment:    05/29/2022   10:30 AM  Fall Risk   Falls in the past year? 0  Number falls in past yr: 0  Injury with Fall? 0  Risk for fall due to : No Fall Risks  Follow up Follow up appointment     Most recent depression screenings:    05/29/2022   10:29 AM 03/14/2021    8:23 AM  PHQ 2/9 Scores  PHQ - 2 Score 0 0    Vision:Within last year and Dental: No regular dental care     Patient Care Team: Denita Lung, MD as PCP - General (Family Medicine)   Outpatient Medications Prior to Visit  Medication Sig   amLODipine (NORVASC) 10 MG tablet Take 1 tablet (10 mg total) by mouth daily.   Ascorbic Acid (VITAMIN C PO) Take by mouth.   ASHWAGANDHA PO Take by mouth.   atorvastatin (LIPITOR) 20 MG tablet Take 1 tablet (20 mg total) by mouth daily.   KRILL OIL PO Take by mouth.   lisinopril-hydrochlorothiazide (ZESTORETIC) 20-12.5 MG tablet TAKE 1 TABELT BY MOUTH ONCE DAILY    metFORMIN (GLUCOPHAGE) 1000 MG tablet Take 1 tablet (1,000 mg total) by mouth 2 (two) times daily with a meal.   Multiple Vitamins-Minerals (ICAPS MV PO) Take by mouth.   Multiple Vitamins-Minerals (ZINC PO) Take by mouth.   pioglitazone (ACTOS) 30 MG tablet Take 1 tablet (30 mg total) by mouth daily.   terazosin (HYTRIN) 2 MG capsule TAKE 1 CAPSULE BY MOUTH EACH NIGHT AT BEDTIME   VITAMIN D PO Take by mouth.   tirzepatide (MOUNJARO) 2.5 MG/0.5ML Pen Inject 2.5 mg into the skin once a week. (Patient not taking: Reported on 05/29/2022)   [DISCONTINUED] dapagliflozin propanediol (FARXIGA) 5 MG TABS tablet Take 1 tablet (5 mg total) by mouth daily before breakfast. (Patient not taking: Reported on 09/18/2021)   No facility-administered medications prior to visit.    Review of Systems  All other systems reviewed and are negative.      Objective:     BP 132/88 (BP Location: Left Arm, Cuff Size: Large)   Pulse 65   Temp 98.2 F (36.8 C) (Oral)   Resp 16   Ht 6\' 1"  (1.854 m)   Wt 243 lb (110.2 kg)   SpO2 99%   BMI 32.06 kg/m    Physical Exam  Alert and in no distress. Tympanic membranes and canals are normal.  Pharyngeal area is normal. Neck is supple without adenopathy or thyromegaly. Cardiac exam shows an irregular rhythm without murmurs or gallops.. Lungs are clear to auscultation.  Foot exam is normal. Results for orders placed or performed in visit on 05/29/22  POCT glycosylated hemoglobin (Hb A1C)  Result Value Ref Range   Hemoglobin A1C 10.3 (A) 4.0 - 5.6 %  POCT Urinalysis DIP (Proadvantage Device)  Result Value Ref Range   Color, UA yellow yellow   Clarity, UA clear clear   Glucose, UA =250 (A) negative mg/dL   Bilirubin, UA negative negative   Ketones, POC UA negative negative mg/dL   Specific Gravity, Urine 1.005    Blood, UA negative negative   pH, UA 6.5 5.0 - 8.0   Protein Ur, POC negative negative mg/dL   Urobilinogen, Ur 0.2    Nitrite, UA Negative Negative    Leukocytes, UA Negative Negative  EKG read by me does show evidence of atrial fibs with a rate of roughly 100.     Assessment & Plan:    Routine general medical examination at a health care facility - Plan: CBC with Differential/Platelet, Comprehensive metabolic panel, Lipid panel, POCT Urinalysis DIP (Proadvantage Device)  Hypertension associated with diabetes ([place]) - Plan: CBC with Differential/Platelet, Comprehensive metabolic panel, amLODipine (NORVASC) 10 MG tablet, lisinopril-hydrochlorothiazide (ZESTORETIC) 20-12.5 MG tablet, terazosin (HYTRIN) 2 MG capsule  Gastroesophageal reflux disease, unspecified whether esophagitis present  Type 2 diabetes mellitus without complication, without long-term current use of insulin (HCC) - Plan: CBC with Differential/Platelet, Comprehensive metabolic panel, Lipid panel, POCT glycosylated hemoglobin (Hb A1C), dapagliflozin propanediol (FARXIGA) 10 MG TABS tablet, metFORMIN (GLUCOPHAGE) 1000 MG tablet, pioglitazone (ACTOS) 30 MG tablet  Hyperlipidemia associated with type 2 diabetes mellitus (HCC) - Plan: Lipid panel, atorvastatin (LIPITOR) 20 MG tablet  Obesity (BMI 30-39.9)  History of COVID-19  Cataract associated with type 2 diabetes mellitus (HCC)  Atrial fibrillation, unspecified type ([place]) - Plan: Ambulatory referral to Cardiology, rivaroxaban (XARELTO) 20 MG TABS tablet   Immunization History  Administered Date(s) Administered   Pneumococcal Conjugate-13 02/18/2017   Pneumococcal Polysaccharide-23 07/13/2019   Tdap 01/23/2007, 02/18/2017    Health Maintenance  Topic Date Due   HIV Screening  Never done   OPHTHALMOLOGY EXAM  10/31/2021   FOOT EXAM  03/14/2022   INFLUENZA VACCINE  07/21/2022 (Originally 11/20/2021)   Zoster Vaccines- Shingrix (1 of 2) 11/02/2022 (Originally 09/05/1982)   Diabetic kidney evaluation - eGFR measurement  09/19/2022   Diabetic kidney evaluation - Urine ACR  09/19/2022   HEMOGLOBIN A1C  11/27/2022    Fecal DNA (Cologuard)  12/08/2022   DTaP/Tdap/Td (3 - Td or Tdap) 02/19/2027   Hepatitis C Screening  Completed   HPV VACCINES  Aged Out   COLONOSCOPY (Pts 45-54yrs Insurance coverage will need to be confirmed)  Discontinued   COVID-19 Vaccine  Discontinued    Discussed health benefits of physical activity, and encouraged him to engage in regular exercise appropriate for his age and condition.  I will add Farxiga back to his regimen and give a discount card.  I will start him on Xarelto and refer to cardiology since this is a new onset of atrial fibs.  Recheck back here in 3 months.  With his A1c now being at 10, it might be time to think about using insulin/GLP-1 medication.  Problem List Items Addressed This Visit     DM2 (diabetes mellitus, type 2) ([place])   Relevant Orders   POCT glycosylated hemoglobin (Hb A1C) (Completed)  GERD (gastroesophageal reflux disease)   History of COVID-19   Hyperlipidemia associated with type 2 diabetes mellitus ([place])   Hypertension associated with diabetes ([place])   Obesity (BMI 30-39.9)   Other Visit Diagnoses     Routine general medical examination at a health care facility    -  Primary   Relevant Orders   POCT Urinalysis DIP (Proadvantage Device) (Completed)      No follow-ups on file.     Jill Alexanders, MD

## 2022-05-29 NOTE — Patient Instructions (Signed)
Atrial Fibrillation  Atrial fibrillation is a type of irregular or rapid heartbeat (arrhythmia). In atrial fibrillation, the top part of the heart (atria) beats in an irregular pattern. This makes the heart unable to pump blood normally and effectively. The goal of treatment is to prevent blood clots from forming, control your heart rate, or restore your heartbeat to a normal rhythm. If this condition is not treated, it can cause serious problems, such as a weakened heart muscle (cardiomyopathy) or a stroke. What are the causes? This condition is often caused by medical conditions that damage the heart's electrical system. These include: High blood pressure (hypertension). This is the most common cause. Certain heart problems or conditions, such as heart failure, coronary artery disease, heart valve problems, or heart surgery. Diabetes. Overactive thyroid (hyperthyroidism). Obesity. Chronic kidney disease. In some cases, the cause of this condition is not known. What increases the risk? This condition is more likely to develop in: Older people. People who smoke. Athletes who do endurance exercise. People who have a family history of atrial fibrillation. Men. People who use drugs. People who drink a lot of alcohol. People who have lung conditions, such as emphysema, pneumonia, or COPD. People who have obstructive sleep apnea. What are the signs or symptoms? Symptoms of this condition include: A feeling that your heart is racing or beating irregularly. Discomfort or pain in your chest. Shortness of breath. Sudden light-headedness or weakness. Tiring easily during exercise or activity. Fatigue. Syncope (fainting). Sweating. In some cases, there are no symptoms. How is this diagnosed? Your health care provider may detect atrial fibrillation when taking your pulse. If detected, this condition may be diagnosed with: An electrocardiogram (ECG) to check electrical signals of the  heart. An ambulatory cardiac monitor to record your heart's activity for a few days. A transthoracic echocardiogram (TTE) to create pictures of your heart. A transesophageal echocardiogram (TEE) to create even closer pictures of your heart. A stress test to check your blood supply while you exercise. Imaging tests, such as a CT scan or chest X-ray. Blood tests. How is this treated? Treatment depends on underlying conditions and how you feel when you experience atrial fibrillation. This condition may be treated with: Medicines to prevent blood clots or to treat heart rate or heart rhythm problems. Electrical cardioversion to reset the heart's rhythm. A pacemaker to correct abnormal heart rhythm. Ablation to remove the heart tissue that sends abnormal signals. Left atrial appendage closure to seal the area where blood clots can form. In some cases, underlying conditions will be treated. Follow these instructions at home: Medicines Take over-the counter and prescription medicines only as told by your health care provider. Do not take any new medicines without talking to your health care provider. If you are taking blood thinners: Talk with your health care provider before you take any medicines that contain aspirin or NSAIDs, such as ibuprofen. These medicines increase your risk for dangerous bleeding. Take your medicine exactly as told, at the same time every day. Avoid activities that could cause injury or bruising, and follow instructions about how to prevent falls. Wear a medical alert bracelet or carry a card that lists what medicines you take. Lifestyle     Do not use any products that contain nicotine or tobacco, such as cigarettes, e-cigarettes, and chewing tobacco. If you need help quitting, ask your health care provider. Eat heart-healthy foods. Talk with a dietitian to make an eating plan that is right for you. Exercise regularly as told   by your health care provider. Do not  drink alcohol. Lose weight if you are overweight. Do not use drugs, including cannabis. General instructions If you have obstructive sleep apnea, manage your condition as told by your health care provider. Do not use diet pills unless your health care provider approves. Diet pills can make heart problems worse. Keep all follow-up visits as told by your health care provider. This is important. Contact a health care provider if you: Notice a change in the rate, rhythm, or strength of your heartbeat. Are taking a blood thinner and you notice more bruising. Tire more easily when you exercise or do heavy work. Have a sudden change in weight. Get help right away if you have:  Chest pain, abdominal pain, sweating, or weakness. Trouble breathing. Side effects of blood thinners, such as blood in your vomit, stool, or urine, or bleeding that cannot stop. Any symptoms of a stroke. "BE FAST" is an easy way to remember the main warning signs of a stroke: B - Balance. Signs are dizziness, sudden trouble walking, or loss of balance. E - Eyes. Signs are trouble seeing or a sudden change in vision. F - Face. Signs are sudden weakness or numbness of the face, or the face or eyelid drooping on one side. A - Arms. Signs are weakness or numbness in an arm. This happens suddenly and usually on one side of the body. S - Speech. Signs are sudden trouble speaking, slurred speech, or trouble understanding what people say. T - Time. Time to call emergency services. Write down what time symptoms started. Other signs of a stroke, such as: A sudden, severe headache with no known cause. Nausea or vomiting. Seizure. These symptoms may represent a serious problem that is an emergency. Do not wait to see if the symptoms will go away. Get medical help right away. Call your local emergency services (911 in the [place] Do not drive yourself to the hospital. Summary Atrial fibrillation is a type of irregular or rapid  heartbeat (arrhythmia). Symptoms include a feeling that your heart is beating fast or irregularly. You may be given medicines to prevent blood clots or to treat heart rate or heart rhythm problems. Get help right away if you have signs or symptoms of a stroke. Get help right away if you cannot catch your breath or have chest pain or pressure. This information is not intended to replace advice given to you by your health care provider. Make sure you discuss any questions you have with your health care provider. Document Revised: 09/30/2018 Document Reviewed: 09/30/2018 Elsevier Patient Education  2023 Elsevier Inc.  

## 2022-05-30 LAB — CBC WITH DIFFERENTIAL/PLATELET
Basophils Absolute: 0 10*3/uL (ref 0.0–0.2)
Basos: 1 %
EOS (ABSOLUTE): 0.1 10*3/uL (ref 0.0–0.4)
Eos: 2 %
Hematocrit: 45.6 % (ref 37.5–51.0)
Hemoglobin: 15.3 g/dL (ref 13.0–17.7)
Immature Grans (Abs): 0 10*3/uL (ref 0.0–0.1)
Immature Granulocytes: 0 %
Lymphocytes Absolute: 1.6 10*3/uL (ref 0.7–3.1)
Lymphs: 27 %
MCH: 30.4 pg (ref 26.6–33.0)
MCHC: 33.6 g/dL (ref 31.5–35.7)
MCV: 91 fL (ref 79–97)
Monocytes Absolute: 0.4 10*3/uL (ref 0.1–0.9)
Monocytes: 7 %
Neutrophils Absolute: 3.7 10*3/uL (ref 1.4–7.0)
Neutrophils: 63 %
Platelets: 220 10*3/uL (ref 150–450)
RBC: 5.04 x10E6/uL (ref 4.14–5.80)
RDW: 12.7 % (ref 11.6–15.4)
WBC: 5.9 10*3/uL (ref 3.4–10.8)

## 2022-05-30 LAB — LIPID PANEL
Chol/HDL Ratio: 3.3 ratio (ref 0.0–5.0)
Cholesterol, Total: 125 mg/dL (ref 100–199)
HDL: 38 mg/dL — ABNORMAL LOW (ref >39)
LDL Chol Calc (NIH): 65 mg/dL (ref 0–99)
Triglycerides: 124 mg/dL (ref 0–149)
VLDL Cholesterol Cal: 22 mg/dL (ref 5–40)

## 2022-05-30 LAB — COMPREHENSIVE METABOLIC PANEL
ALT: 26 IU/L (ref 0–44)
AST: 18 IU/L (ref 0–40)
Albumin/Globulin Ratio: 2.6 — ABNORMAL HIGH (ref 1.2–2.2)
Albumin: 5 g/dL — ABNORMAL HIGH (ref 3.8–4.9)
Alkaline Phosphatase: 99 IU/L (ref 44–121)
BUN/Creatinine Ratio: 15 (ref 9–20)
BUN: 14 mg/dL (ref 6–24)
Bilirubin Total: 0.8 mg/dL (ref 0.0–1.2)
CO2: 27 mmol/L (ref 20–29)
Calcium: 10.1 mg/dL (ref 8.7–10.2)
Chloride: 100 mmol/L (ref 96–106)
Creatinine, Ser: 0.95 mg/dL (ref 0.76–1.27)
Globulin, Total: 1.9 g/dL (ref 1.5–4.5)
Glucose: 189 mg/dL — ABNORMAL HIGH (ref 70–99)
Potassium: 3.7 mmol/L (ref 3.5–5.2)
Sodium: 143 mmol/L (ref 134–144)
Total Protein: 6.9 g/dL (ref 6.0–8.5)
eGFR: 93 mL/min/{1.73_m2} (ref >59)

## 2022-06-04 NOTE — Addendum Note (Signed)
Addended by: Denita Lung on: 06/04/2022 11:13 AM   Modules accepted: Orders

## 2022-06-24 ENCOUNTER — Other Ambulatory Visit: Payer: Self-pay | Admitting: *Deleted

## 2022-06-24 ENCOUNTER — Ambulatory Visit: Payer: BC Managed Care – PPO | Attending: Interventional Cardiology | Admitting: Interventional Cardiology

## 2022-06-24 ENCOUNTER — Encounter: Payer: Self-pay | Admitting: Interventional Cardiology

## 2022-06-24 VITALS — BP 130/78 | HR 109 | Ht 73.0 in | Wt 236.8 lb

## 2022-06-24 DIAGNOSIS — E1159 Type 2 diabetes mellitus with other circulatory complications: Secondary | ICD-10-CM | POA: Diagnosis not present

## 2022-06-24 DIAGNOSIS — I10 Essential (primary) hypertension: Secondary | ICD-10-CM | POA: Diagnosis not present

## 2022-06-24 DIAGNOSIS — I4819 Other persistent atrial fibrillation: Secondary | ICD-10-CM | POA: Diagnosis not present

## 2022-06-24 DIAGNOSIS — E782 Mixed hyperlipidemia: Secondary | ICD-10-CM

## 2022-06-24 DIAGNOSIS — E669 Obesity, unspecified: Secondary | ICD-10-CM

## 2022-06-24 MED ORDER — METOPROLOL SUCCINATE ER 50 MG PO TB24: 50.0000 mg | ORAL_TABLET | Freq: Every day | ORAL | 3 refills | Status: DC

## 2022-06-24 NOTE — Patient Instructions (Signed)
Medication Instructions:  Your physician has recommended you make the following change in your medication: Start Metoprolol Succinate 50 mg by mouth daily   *If you need a refill on your cardiac medications before your next appointment, please call your pharmacy*   Lab Work: none If you have labs (blood work) drawn today and your tests are completely normal, you will receive your results only by: [facility] (if you have MyChart) OR A paper copy in the mail If you have any lab test that is abnormal or we need to change your treatment, we will call you to review the results.   Testing/Procedures: Your physician has requested that you have an echocardiogram. Echocardiography is a painless test that uses sound waves to create images of your heart. It provides your doctor with information about the size and shape of your heart and how well your heart's chambers and valves are working. This procedure takes approximately one hour. There are no restrictions for this procedure. Please do NOT wear cologne, perfume, aftershave, or lotions (deodorant is allowed). Please arrive 15 minutes prior to your appointment time.   Your physician has recommended that you have a Cardioversion (DCCV). Electrical Cardioversion uses a jolt of electricity to your heart either through paddles or wired patches attached to your chest. This is a controlled, usually prescheduled, procedure. Defibrillation is done under light anesthesia in the hospital, and you usually go home the day of the procedure. This is done to get your heart back into a normal rhythm. You are not awake for the procedure. Please see the instruction sheet given to you today. Scheduled for July 12, 2022   Follow-Up: At [facility], you and your health needs are our priority.  As part of our continuing mission to provide you with exceptional heart care, we have created designated Provider Care Teams.  These Care Teams include your primary  Cardiologist (physician) and Advanced Practice Providers (APPs -  Physician Assistants and Nurse Practitioners) who all work together to provide you with the care you need, when you need it.  We recommend signing up for the patient portal called "MyChart".  Sign up information is provided on this After Visit Summary.  MyChart is used to connect with patients for Virtual Visits (Telemedicine).  Patients are able to view lab/test results, encounter notes, upcoming appointments, etc.  Non-urgent messages can be sent to your provider as well.   To learn more about what you can do with MyChart, go to [email or web address].    Your next appointment:   2 week(s) after March 22 Cardioversion  Provider:   Larae Grooms, MD or APP    Other Instructions      Dear Marcus Holland  You are scheduled for a Cardioversion on Friday, March 22 with Dr. Sallyanne Kuster.  Please arrive at the [facility] (Main Entrance A) at [facility]: [address] [place], [place] [address] at 6:00 AM   DIET:  Nothing to eat or drink after midnight except a sip of water with medications (see medication instructions below)  MEDICATION INSTRUCTIONS: Do not take any diabetes medication the morning of the procedre Continue taking your anticoagulant (blood thinner): Rivaroxaban (Xarelto).  You will need to continue this after your procedure until you are told by your provider that it is safe to stop.    LABS:   Your labs will be done at the hospital prior to your procedure - you will need to arrive 1 and 1/2 hours prior to  your procedure.  FYI:  For your safety, and to allow [place] to monitor your vital signs accurately during the surgery/procedure we request: If you have artificial nails, gel coating, SNS etc, please have those removed prior to your surgery/procedure. Not having the nail coverings /polish removed may result in cancellation or delay of your surgery/procedure.  You must have a responsible person to drive you  home and stay in the waiting area during your procedure. Failure to do so could result in cancellation.  Bring your insurance cards.  *Special Note: Every effort is made to have your procedure done on time. Occasionally there are emergencies that occur at the hospital that may cause delays. Please be patient if a delay does occur.

## 2022-06-24 NOTE — Progress Notes (Signed)
Cardiology Office Note   Date:  06/24/2022   ID:  Marcus, Holland [birth year], MRN [id number]  PCP:  Marcus Lung, MD    No chief complaint on file.  AFib  Wt Readings from Last 3 Encounters:  06/24/22 236 lb 12.8 oz (107.4 kg)  05/29/22 243 lb (110.2 kg)  01/22/22 245 lb 6.4 oz (111.3 kg)       History of Present Illness: Marcus Holland is a 57 y.o. male who is being seen today for the evaluation of AFib at the request of Marcus Lung, MD.   ECG in November 2022 showed normal sinus rhythm.  Routine ECG at appointment in February 2024 showed atrial fibrillation with rapid ventricular response.  He was started on Xarelto in February.    Past Medical History:  Diagnosis Date   A-fib ([facility])    Diabetes mellitus without complication (HCC)    Elevated lipids    GERD (gastroesophageal reflux disease)    History of COVID-19    Hypertension    Obesity    Poor compliance     Past Surgical History:  Procedure Laterality Date   CHOLECYSTECTOMY  2016   COLONOSCOPY  2004   Dr. Benson Holland   FINGER SURGERY       Current Outpatient Medications  Medication Sig Dispense Refill   amLODipine (NORVASC) 10 MG tablet Take 1 tablet (10 mg total) by mouth daily. 90 tablet 3   Ascorbic Acid (VITAMIN C PO) Take by mouth.     atorvastatin (LIPITOR) 20 MG tablet Take 1 tablet (20 mg total) by mouth daily. 90 tablet 3   dapagliflozin propanediol (FARXIGA) 10 MG TABS tablet Take 1 tablet (10 mg total) by mouth daily before breakfast. 90 tablet 5   KRILL OIL PO Take by mouth.     lisinopril-hydrochlorothiazide (ZESTORETIC) 20-12.5 MG tablet TAKE 1 TABELT BY MOUTH ONCE DAILY 90 tablet 3   metFORMIN (GLUCOPHAGE) 1000 MG tablet Take 1 tablet (1,000 mg total) by mouth 2 (two) times daily with a meal. 180 tablet 1   Multiple Vitamins-Minerals (ICAPS MV PO) Take by mouth.     Multiple Vitamins-Minerals (ZINC PO) Take by mouth.     pioglitazone (ACTOS) 30 MG tablet Take 1 tablet (30 mg total) by mouth  daily. 90 tablet 1   rivaroxaban (XARELTO) 20 MG TABS tablet Take 1 tablet (20 mg total) by mouth daily with supper. 90 tablet 1   terazosin (HYTRIN) 2 MG capsule TAKE 1 CAPSULE BY MOUTH EACH NIGHT AT BEDTIME 90 capsule 3   VITAMIN D PO Take by mouth.     ASHWAGANDHA PO Take by mouth.     tirzepatide (MOUNJARO) 2.5 MG/0.5ML Pen Inject 2.5 mg into the skin once a week. 2 mL 0   No current facility-administered medications for this visit.    Allergies:   Patient has no known allergies.    Social History:  The patient  reports that he has never smoked. He has never used smokeless tobacco. He reports that he does not drink alcohol and does not use drugs.   Family History:  The patient's family history includes Cancer (age of onset: 14) in his father.    ROS:  Please see the history of present illness.   Otherwise, review of systems are positive for diabetes control difficult, but improving.   All other systems are reviewed and negative.    PHYSICAL EXAM: VS:  BP 130/78   Pulse (!) 109  Ht '6\' 1"'$  (1.854 m)   Wt 236 lb 12.8 oz (107.4 kg)   SpO2 96%   BMI 31.24 kg/m  , BMI Body mass index is 31.24 kg/m. GEN: Well nourished, well developed, in no acute distress HEENT: normal Neck: no JVD, carotid bruits, or masses Cardiac: irregularly iregular; no murmurs, rubs, or gallops,no edema  Respiratory:  clear to auscultation bilaterally, normal work of breathing GI: soft, nontender, nondistended, + BS MS: no deformity or atrophy Skin: warm and dry, no rash Neuro:  Strength and sensation are intact Psych: euthymic mood, full affect   EKG:   The ekg ordered today demonstrates AFib, RVR   Recent Labs: 05/29/2022: ALT 26; BUN 14; Creatinine, Ser 0.95; Hemoglobin 15.3; Platelets 220; Potassium 3.7; Sodium 143   Lipid Panel    Component Value Date/Time   CHOL 125 05/29/2022 1140   TRIG 124 05/29/2022 1140   HDL 38 (L) 05/29/2022 1140   CHOLHDL 3.3 05/29/2022 1140   CHOLHDL 2.9  02/18/2017 0956   VLDL 18 08/11/2015 0001   LDLCALC 65 05/29/2022 1140   LDLCALC 60 02/18/2017 0956     Other studies Reviewed: Additional studies/ records that were reviewed today with results demonstrating: labs reviewed.   ASSESSMENT AND PLAN:  Atrial fibrillation: Xarelto started in February by his primary care doctor.  Rare palpitations.  Check echo.  Minimize caffeine- uses Crystal light with "energy." (Caffeinated).  Add metoprolol ER 50 mg daily.  Risks and benefits of DCCV explained.  Patient is agreeable. Type 2 diabetes: A1c 10.3 in February 2024.  Not on Mounjaro.  Hyperlipidemia: LDL 65 in February 2024.  Whole food, plant based diet.   Hypertension: Avoid excessive salt.  The current medical regimen is effective;  continue present plan and medications. Obesity: Whole food, plant-based diet.  High-fiber diet.  Avoid processed foods.   Family history of coronary arteriosclerosis: Healthy lifestyle addressed.    CHA2DS2-VASc Score = 2   This indicates a 2.2% annual risk of stroke. The patient's score is based upon: CHF History: 0 HTN History: 1 Diabetes History: 1 Stroke History: 0 Vascular Disease History: 0 Age Score: 0 Gender Score: 0     Current medicines are reviewed at length with the patient today.  The patient concerns regarding his medicines were addressed.  The following changes have been made:    Labs/ tests ordered today include:  No orders of the defined types were placed in this encounter.   Recommend 150 minutes/week of aerobic exercise Low fat, low carb, high fiber diet recommended  Disposition:   FU in 2 weeks post cardioversion   Signed, Marcus Grooms, MD  06/24/2022 10:13 AM    [facility] [place], [place], [place]  [address] Phone: [phone]; Fax: [phone]

## 2022-06-24 NOTE — H&P (View-Only) (Signed)
  Cardiology Office Note   Date:  06/24/2022   ID:  Marcus Holland, DOB 11/16/1965, MRN [id number]  PCP:  Lalonde, John C, MD    No chief complaint on file.  AFib  Wt Readings from Last 3 Encounters:  06/24/22 236 lb 12.8 oz (107.4 kg)  05/29/22 243 lb (110.2 kg)  01/22/22 245 lb 6.4 oz (111.3 kg)       History of Present Illness: Marcus Holland is a 57 y.o. male who is being seen today for the evaluation of AFib at the request of Lalonde, John C, MD.   ECG in November 2022 showed normal sinus rhythm.  Routine ECG at appointment in February 2024 showed atrial fibrillation with rapid ventricular response.  He was started on Xarelto in February.    Past Medical History:  Diagnosis Date   A-fib (HCC)    Diabetes mellitus without complication (HCC)    Elevated lipids    GERD (gastroesophageal reflux disease)    History of COVID-19    Hypertension    Obesity    Poor compliance     Past Surgical History:  Procedure Laterality Date   CHOLECYSTECTOMY  2016   COLONOSCOPY  2004   Dr. Hung   FINGER SURGERY       Current Outpatient Medications  Medication Sig Dispense Refill   amLODipine (NORVASC) 10 MG tablet Take 1 tablet (10 mg total) by mouth daily. 90 tablet 3   Ascorbic Acid (VITAMIN C PO) Take by mouth.     atorvastatin (LIPITOR) 20 MG tablet Take 1 tablet (20 mg total) by mouth daily. 90 tablet 3   dapagliflozin propanediol (FARXIGA) 10 MG TABS tablet Take 1 tablet (10 mg total) by mouth daily before breakfast. 90 tablet 5   KRILL OIL PO Take by mouth.     lisinopril-hydrochlorothiazide (ZESTORETIC) 20-12.5 MG tablet TAKE 1 TABELT BY MOUTH ONCE DAILY 90 tablet 3   metFORMIN (GLUCOPHAGE) 1000 MG tablet Take 1 tablet (1,000 mg total) by mouth 2 (two) times daily with a meal. 180 tablet 1   Multiple Vitamins-Minerals (ICAPS MV PO) Take by mouth.     Multiple Vitamins-Minerals (ZINC PO) Take by mouth.     pioglitazone (ACTOS) 30 MG tablet Take 1 tablet (30 mg total) by mouth  daily. 90 tablet 1   rivaroxaban (XARELTO) 20 MG TABS tablet Take 1 tablet (20 mg total) by mouth daily with supper. 90 tablet 1   terazosin (HYTRIN) 2 MG capsule TAKE 1 CAPSULE BY MOUTH EACH NIGHT AT BEDTIME 90 capsule 3   VITAMIN D PO Take by mouth.     ASHWAGANDHA PO Take by mouth.     tirzepatide (MOUNJARO) 2.5 MG/0.5ML Pen Inject 2.5 mg into the skin once a week. 2 mL 0   No current facility-administered medications for this visit.    Allergies:   Patient has no known allergies.    Social History:  The patient  reports that he has never smoked. He has never used smokeless tobacco. He reports that he does not drink alcohol and does not use drugs.   Family History:  The patient's family history includes Cancer (age of onset: 72) in his father.    ROS:  Please see the history of present illness.   Otherwise, review of systems are positive for diabetes control difficult, but improving.   All other systems are reviewed and negative.    PHYSICAL EXAM: VS:  BP 130/78   Pulse (!) 109     Ht 6' 1" (1.854 m)   Wt 236 lb 12.8 oz (107.4 kg)   SpO2 96%   BMI 31.24 kg/m  , BMI Body mass index is 31.24 kg/m. GEN: Well nourished, well developed, in no acute distress HEENT: normal Neck: no JVD, carotid bruits, or masses Cardiac: irregularly iregular; no murmurs, rubs, or gallops,no edema  Respiratory:  clear to auscultation bilaterally, normal work of breathing GI: soft, nontender, nondistended, + BS MS: no deformity or atrophy Skin: warm and dry, no rash Neuro:  Strength and sensation are intact Psych: euthymic mood, full affect   EKG:   The ekg ordered today demonstrates AFib, RVR   Recent Labs: 05/29/2022: ALT 26; BUN 14; Creatinine, Ser 0.95; Hemoglobin 15.3; Platelets 220; Potassium 3.7; Sodium 143   Lipid Panel    Component Value Date/Time   CHOL 125 05/29/2022 1140   TRIG 124 05/29/2022 1140   HDL 38 (L) 05/29/2022 1140   CHOLHDL 3.3 05/29/2022 1140   CHOLHDL 2.9  02/18/2017 0956   VLDL 18 08/11/2015 0001   LDLCALC 65 05/29/2022 1140   LDLCALC 60 02/18/2017 0956     Other studies Reviewed: Additional studies/ records that were reviewed today with results demonstrating: labs reviewed.   ASSESSMENT AND PLAN:  Atrial fibrillation: Xarelto started in February by his primary care doctor.  Rare palpitations.  Check echo.  Minimize caffeine- uses Crystal light with "energy." (Caffeinated).  Add metoprolol ER 50 mg daily.  Risks and benefits of DCCV explained.  Patient is agreeable. Type 2 diabetes: A1c 10.3 in February 2024.  Not on Mounjaro.  Hyperlipidemia: LDL 65 in February 2024.  Whole food, plant based diet.   Hypertension: Avoid excessive salt.  The current medical regimen is effective;  continue present plan and medications. Obesity: Whole food, plant-based diet.  High-fiber diet.  Avoid processed foods.   Family history of coronary arteriosclerosis: Healthy lifestyle addressed.    CHA2DS2-VASc Score = 2   This indicates a 2.2% annual risk of stroke. The patient's score is based upon: CHF History: 0 HTN History: 1 Diabetes History: 1 Stroke History: 0 Vascular Disease History: 0 Age Score: 0 Gender Score: 0     Current medicines are reviewed at length with the patient today.  The patient concerns regarding his medicines were addressed.  The following changes have been made:    Labs/ tests ordered today include:  No orders of the defined types were placed in this encounter.   Recommend 150 minutes/week of aerobic exercise Low fat, low carb, high fiber diet recommended  Disposition:   FU in 2 weeks post cardioversion   Signed, Jasier Calabretta, MD  06/24/2022 10:13 AM    [facility] [address], , [place]  [address] Phone: [phone]; Fax: [phone]   

## 2022-06-27 ENCOUNTER — Ambulatory Visit (HOSPITAL_COMMUNITY): Payer: BC Managed Care – PPO | Attending: Internal Medicine

## 2022-06-27 DIAGNOSIS — I4819 Other persistent atrial fibrillation: Secondary | ICD-10-CM

## 2022-06-27 DIAGNOSIS — I10 Essential (primary) hypertension: Secondary | ICD-10-CM | POA: Diagnosis not present

## 2022-06-27 LAB — ECHOCARDIOGRAM COMPLETE
Calc EF: 51.1 %
S' Lateral: 3.5 cm
Single Plane A2C EF: 51.7 %
Single Plane A4C EF: 48.7 %

## 2022-06-27 MED ORDER — PERFLUTREN LIPID MICROSPHERE
1.0000 mL | INTRAVENOUS | Status: AC | PRN
  Administered 2022-06-27: 2 mL via INTRAVENOUS

## 2022-06-28 ENCOUNTER — Telehealth: Payer: Self-pay

## 2022-06-28 NOTE — Telephone Encounter (Signed)
-----   Message from Jettie Booze, MD sent at 06/28/2022  4:46 PM EST ----- Normal LV/RV and valvular function.  Normal atrial sizes. COntinue with plans for DCCV.

## 2022-06-28 NOTE — Telephone Encounter (Signed)
Patient is aware of results of echo and verbalized understanding. No questions at this time.

## 2022-07-11 NOTE — Anesthesia Preprocedure Evaluation (Addendum)
Anesthesia Evaluation  Patient identified by MRN, date of birth, ID band Patient awake    Reviewed: Allergy & Precautions, NPO status , Patient's Chart, lab work & pertinent test results  History of Anesthesia Complications Negative for: history of anesthetic complications  Airway Mallampati: III  TM Distance: >3 FB Neck ROM: Full    Dental  (+) Dental Advisory Given, Teeth Intact   Pulmonary neg pulmonary ROS   Pulmonary exam normal        Cardiovascular hypertension, Pt. on home beta blockers and Pt. on medications + dysrhythmias Atrial Fibrillation  Rhythm:Irregular Rate:Normal   '24 TTE - EF 50 to 55%. Trivial MR. There is borderline dilatation of the ascending aorta, measuring 39 mm.       Neuro/Psych negative neurological ROS  negative psych ROS   GI/Hepatic Neg liver ROS,GERD  Controlled,,  Endo/Other  diabetes, Type 2, Oral Hypoglycemic Agents    Renal/GU negative Renal ROS     Musculoskeletal  (+) Arthritis ,    Abdominal   Peds  Hematology  On xarelto    Anesthesia Other Findings   Reproductive/Obstetrics                             Anesthesia Physical Anesthesia Plan  ASA: 3  Anesthesia Plan: General   Post-op Pain Management: Minimal or no pain anticipated   Induction: Intravenous  PONV Risk Score and Plan: 2 and Treatment may vary due to age or medical condition and Propofol infusion  Airway Management Planned: Natural Airway and Mask  Additional Equipment: None  Intra-op Plan:   Post-operative Plan:   Informed Consent: I have reviewed the patients History and Physical, chart, labs and discussed the procedure including the risks, benefits and alternatives for the proposed anesthesia with the patient or authorized representative who has indicated his/her understanding and acceptance.       Plan Discussed with: CRNA and Anesthesiologist  Anesthesia  Plan Comments:         Anesthesia Quick Evaluation

## 2022-07-12 ENCOUNTER — Encounter (HOSPITAL_COMMUNITY)
Admission: RE | Disposition: A | Discharge status: Home / Self Care | Payer: Self-pay | Source: Ambulatory Visit | Attending: Cardiovascular Disease

## 2022-07-12 ENCOUNTER — Ambulatory Visit (HOSPITAL_COMMUNITY): Payer: BC Managed Care – PPO | Admitting: Certified Registered Nurse Anesthetist

## 2022-07-12 ENCOUNTER — Other Ambulatory Visit: Payer: Self-pay

## 2022-07-12 ENCOUNTER — Encounter (HOSPITAL_COMMUNITY): Payer: Self-pay | Admitting: Cardiovascular Disease

## 2022-07-12 ENCOUNTER — Ambulatory Visit (HOSPITAL_COMMUNITY)
Admission: RE | Admit: 2022-07-12 | Discharge: 2022-07-12 | Disposition: A | Discharge status: Home / Self Care | Payer: BC Managed Care – PPO | Source: Ambulatory Visit | Attending: Cardiovascular Disease | Admitting: Cardiovascular Disease

## 2022-07-12 DIAGNOSIS — Z79899 Other long term (current) drug therapy: Secondary | ICD-10-CM | POA: Diagnosis not present

## 2022-07-12 DIAGNOSIS — E119 Type 2 diabetes mellitus without complications: Secondary | ICD-10-CM | POA: Diagnosis not present

## 2022-07-12 DIAGNOSIS — Z6831 Body mass index (BMI) 31.0-31.9, adult: Secondary | ICD-10-CM | POA: Diagnosis not present

## 2022-07-12 DIAGNOSIS — I4819 Other persistent atrial fibrillation: Secondary | ICD-10-CM | POA: Diagnosis not present

## 2022-07-12 DIAGNOSIS — Z7984 Long term (current) use of oral hypoglycemic drugs: Secondary | ICD-10-CM | POA: Diagnosis not present

## 2022-07-12 DIAGNOSIS — Z7901 Long term (current) use of anticoagulants: Secondary | ICD-10-CM | POA: Insufficient documentation

## 2022-07-12 DIAGNOSIS — I4891 Unspecified atrial fibrillation: Secondary | ICD-10-CM | POA: Diagnosis not present

## 2022-07-12 DIAGNOSIS — I10 Essential (primary) hypertension: Secondary | ICD-10-CM | POA: Insufficient documentation

## 2022-07-12 DIAGNOSIS — E785 Hyperlipidemia, unspecified: Secondary | ICD-10-CM | POA: Diagnosis not present

## 2022-07-12 DIAGNOSIS — E669 Obesity, unspecified: Secondary | ICD-10-CM | POA: Diagnosis not present

## 2022-07-12 DIAGNOSIS — Z8249 Family history of ischemic heart disease and other diseases of the circulatory system: Secondary | ICD-10-CM | POA: Diagnosis not present

## 2022-07-12 DIAGNOSIS — K219 Gastro-esophageal reflux disease without esophagitis: Secondary | ICD-10-CM | POA: Insufficient documentation

## 2022-07-12 HISTORY — PX: CARDIOVERSION: SHX1299

## 2022-07-12 LAB — POCT I-STAT, CHEM 8
BUN: 20 mg/dL (ref 6–20)
Calcium, Ion: 1.2 mmol/L (ref 1.15–1.40)
Chloride: 100 mmol/L (ref 98–111)
Creatinine, Ser: 0.9 mg/dL (ref 0.61–1.24)
Glucose, Bld: 164 mg/dL — ABNORMAL HIGH (ref 70–99)
HCT: 38 % — ABNORMAL LOW (ref 39.0–52.0)
Hemoglobin: 12.9 g/dL — ABNORMAL LOW (ref 13.0–17.0)
Potassium: 3.3 mmol/L — ABNORMAL LOW (ref 3.5–5.1)
Sodium: 142 mmol/L (ref 135–145)
TCO2: 27 mmol/L (ref 22–32)

## 2022-07-12 SURGERY — CARDIOVERSION
Anesthesia: General

## 2022-07-12 MED ORDER — SODIUM CHLORIDE 0.9 % IV SOLN: INTRAVENOUS | Status: DC

## 2022-07-12 MED ORDER — LIDOCAINE 2% (20 MG/ML) 5 ML SYRINGE
INTRAMUSCULAR | Status: DC | PRN
  Administered 2022-07-12: 60 mg via INTRAVENOUS

## 2022-07-12 MED ORDER — PROPOFOL 10 MG/ML IV BOLUS
INTRAVENOUS | Status: DC | PRN
  Administered 2022-07-12: 80 mg via INTRAVENOUS

## 2022-07-12 NOTE — Transfer of Care (Signed)
Immediate Anesthesia Transfer of Care Note  Patient: Marcus Holland  Procedure(s) Performed: CARDIOVERSION  Patient Location: Endoscopy Unit  Anesthesia Type:General  Level of Consciousness: drowsy and patient cooperative  Airway & Oxygen Therapy: Patient Spontanous Breathing  Post-op Assessment: Report given to RN, Post -op Vital signs reviewed and stable, and Patient moving all extremities X 4  Post vital signs: Reviewed and stable  Last Vitals:  Vitals Value Taken Time  BP 132/91   Temp    Pulse 69   Resp 12   SpO2 95     Last Pain:  Vitals:   07/12/22 0652  TempSrc: Temporal  PainSc: 0-No pain         Complications: No notable events documented.

## 2022-07-12 NOTE — Interval H&P Note (Signed)
History and Physical Interval Note:  07/12/2022 7:45 AM  Marcus Holland  has presented today for surgery, with the diagnosis of AFIB.  The various methods of treatment have been discussed with the patient and family. After consideration of risks, benefits and other options for treatment, the patient has consented to  Procedure(s): CARDIOVERSION (N/A) as a surgical intervention.  The patient's history has been reviewed, patient examined, no change in status, stable for surgery.  I have reviewed the patient's chart and labs.  Questions were answered to the patient's satisfaction.     Petros Ahart

## 2022-07-12 NOTE — Op Note (Signed)
Procedure: Electrical Cardioversion Indications:  Atrial Fibrillation  Procedure Details:  Consent: Risks of procedure as well as the alternatives and risks of each were explained to the (patient/caregiver).  Consent for procedure obtained.  Time Out: Verified patient identification, verified procedure, site/side was marked, verified correct patient position, special equipment/implants available, medications/allergies/relevent history reviewed, required imaging and test results available.  Performed  Patient placed on cardiac monitor, pulse oximetry, supplemental oxygen as necessary.  Sedation given:  propofol  80 mg IV, Dr. Fransisco Beau Pacer pads placed anterior and posterior chest.  Cardioverted 2 time(s). First shock at 120J was unsuccessful. Cardioversion with second 200J synchronized biphasic biphasic shock.  Evaluation: Findings: Post procedure EKG shows: NSR Complications: None Patient did tolerate procedure well.  Time Spent Directly with the Patient:  37minutes   Rani Sisney 07/12/2022, 7:52 AM

## 2022-07-12 NOTE — Anesthesia Postprocedure Evaluation (Signed)
Anesthesia Post Note  Patient: Marcus Holland  Procedure(s) Performed: CARDIOVERSION     Patient location during evaluation: PACU Anesthesia Type: General Level of consciousness: awake and alert Pain management: pain level controlled Vital Signs Assessment: post-procedure vital signs reviewed and stable Respiratory status: spontaneous breathing, nonlabored ventilation and respiratory function stable Cardiovascular status: stable and blood pressure returned to baseline Anesthetic complications: no   No notable events documented.  Last Vitals:  Vitals:   07/12/22 0803 07/12/22 0812  BP: 124/83 (!) 140/88  Pulse: 72 72  Resp: 16 16  Temp:    SpO2: 97% (!) 16%    Last Pain:  Vitals:   07/12/22 0812  TempSrc:   PainSc: 0-No pain                 Audry Pili

## 2022-07-25 NOTE — Progress Notes (Addendum)
Cardiology Office Note:    Date:  07/26/2022  ID:  [name], DOB [birth year], MRN [id number] [facility] Providers Cardiologist:  Lance Muss, MD      Patient Profile:   Persistent atrial fibrillation TTE 06/27/2022: EF 50-55, no RWMA, NL RVSF, trivial MR, borderline aorta (39 mm) Diabetes mellitus  Hyperlipidemia  Hypertension  Obesity  FHx of CAD     History of Present Illness:   Marcus Holland is a 57 y.o. male returns for follow-up after recent cardioversion.  He established with Dr. Eldridge Dace 06/24/2022 after being diagnosed with atrial fibrillation with rapid rate at his PCPs office.  Cardioversion was performed 07/12/2022 and was successful after 2 shocks.  He is here today with his wife.  Since the cardioversion, he has felt well.  He has not had dizziness, light headedness, near syncope.  He has not had chest pain, shortness of breath.  He has not had palpitations.  The only symptom he had when he was in atrial fibrillation was elevated blood pressures.  Review of Systems  Gastrointestinal:  Negative for hematochezia and melena.  Genitourinary:  Negative for hematuria.       Studies Reviewed:    EKG: Sinus bradycardia, HR 54, normal axis, no ST-T wave changes, QTc 434   Risk Assessment/Calculations:    CHA2DS2-VASc Score = 2   This indicates a 2.2% annual risk of stroke. The patient's score is based upon: CHF History: 0 HTN History: 1 Diabetes History: 1 Stroke History: 0 Vascular Disease History: 0 Age Score: 0 Gender Score: 0            Physical Exam:   VS:  BP 126/68   Pulse (!) 54   Ht 6\' 1"  (1.854 m)   Wt 235 lb 9.6 oz (106.9 kg)   SpO2 98%   BMI 31.08 kg/m    Wt Readings from Last 3 Encounters:  07/26/22 235 lb 9.6 oz (106.9 kg)  07/12/22 230 lb (104.3 kg)  06/24/22 236 lb 12.8 oz (107.4 kg)    Constitutional:      Appearance: Healthy appearance. Not in distress.  Neck:     Vascular: JVD normal.  Pulmonary:     Breath sounds: Normal  breath sounds. No wheezing. No rales.  Cardiovascular:     Bradycardia present. Regular rhythm. Normal S1. Normal S2.      Murmurs: There is no murmur.  Edema:    Peripheral edema absent.  Abdominal:     Palpations: Abdomen is soft.       ASSESSMENT AND PLAN:   Persistent atrial fibrillation (HCC) Maintaining normal sinus rhythm. He is bradycardic, but asymptomatic. I think he can remain on his current dose of beta-blocker. He is tolerating anticoagulation well. His Hgb and K+ were low on iStat in the hospital. We discussed further rhythm management strategies if he has recurrent atrial fibrillation (antiarrhythmic meds vs PVI ablation). His CHADS2-VASc is 2. Therefore, he will need to remain on anticoagulation. His wife notes that he does not snore. He had a sleep study a few years ago that was normal.  Continue Xarelto 20 mg daily, metoprolol succinate 50 mg daily BMET, CBC Follow-up 6 months   Hypertension associated with diabetes (HCC) Blood pressure is well-controlled.  Continue amlodipine 10 mg daily, lisinopril/HCTZ 20/12.5 mg daily, metoprolol succinate 50 mg daily, terazosin 2 mg daily.  Low hemoglobin Noted on i-STAT in the hospital.  I suspect this was delusional.  He is on anticoagulation.  Obtain  follow-up CBC.  Hypokalemia He had a potassium of 3.3 in the hospital.  He remains on thiazide diuretic.  Obtain follow-up BMET today.        Dispo:  Return in about 6 months (around 01/25/2023) for Routine Follow Up w/ Dr. Eldridge Dace.  Signed, Tereso Newcomer, PA-C

## 2022-07-26 ENCOUNTER — Encounter: Payer: Self-pay | Admitting: Physician Assistant

## 2022-07-26 ENCOUNTER — Ambulatory Visit: Payer: BC Managed Care – PPO | Attending: Physician Assistant | Admitting: Physician Assistant

## 2022-07-26 VITALS — BP 126/68 | HR 54 | Ht 73.0 in | Wt 235.6 lb

## 2022-07-26 DIAGNOSIS — E1159 Type 2 diabetes mellitus with other circulatory complications: Secondary | ICD-10-CM

## 2022-07-26 DIAGNOSIS — D649 Anemia, unspecified: Secondary | ICD-10-CM

## 2022-07-26 DIAGNOSIS — I4819 Other persistent atrial fibrillation: Secondary | ICD-10-CM | POA: Diagnosis not present

## 2022-07-26 DIAGNOSIS — I152 Hypertension secondary to endocrine disorders: Secondary | ICD-10-CM

## 2022-07-26 DIAGNOSIS — E876 Hypokalemia: Secondary | ICD-10-CM | POA: Diagnosis not present

## 2022-07-26 NOTE — Assessment & Plan Note (Signed)
Noted on i-STAT in the hospital.  I suspect this was delusional.  He is on anticoagulation.  Obtain follow-up CBC.

## 2022-07-26 NOTE — Patient Instructions (Signed)
Medication Instructions:  Your physician recommends that you continue on your current medications as directed. Please refer to the Current Medication list given to you today.  *If you need a refill on your cardiac medications before your next appointment, please call your pharmacy*   Lab Work: TODAY:  BMET & CBC  If you have labs (blood work) drawn today and your tests are completely normal, you will receive your results only by: MyChart Message (if you have MyChart) OR A paper copy in the mail If you have any lab test that is abnormal or we need to change your treatment, we will call you to review the results.   Testing/Procedures: None ordered   Follow-Up: At [facility], you and your health needs are our priority.  As part of our continuing mission to provide you with exceptional heart care, we have created designated Provider Care Teams.  These Care Teams include your primary Cardiologist (physician) and Advanced Practice Providers (APPs -  Physician Assistants and Nurse Practitioners) who all work together to provide you with the care you need, when you need it.  We recommend signing up for the patient portal called "MyChart".  Sign up information is provided on this After Visit Summary.  MyChart is used to connect with patients for Virtual Visits (Telemedicine).  Patients are able to view lab/test results, encounter notes, upcoming appointments, etc.  Non-urgent messages can be sent to your provider as well.   To learn more about what you can do with MyChart, go to [email or web address].    Your next appointment:   6 month(s)  Provider:   Lance Muss, MD     Other Instructions

## 2022-07-26 NOTE — Assessment & Plan Note (Addendum)
Maintaining normal sinus rhythm. He is bradycardic, but asymptomatic. I think he can remain on his current dose of beta-blocker. He is tolerating anticoagulation well. His Hgb and K+ were low on iStat in the hospital. We discussed further rhythm management strategies if he has recurrent atrial fibrillation (antiarrhythmic meds vs PVI ablation). His CHADS2-VASc is 2. Therefore, he will need to remain on anticoagulation. His wife notes that he does not snore. He had a sleep study a few years ago that was normal.  Continue Xarelto 20 mg daily, metoprolol succinate 50 mg daily BMET, CBC Follow-up 6 months

## 2022-07-26 NOTE — Assessment & Plan Note (Signed)
Blood pressure is well-controlled.  Continue amlodipine 10 mg daily, lisinopril/HCTZ 20/12.5 mg daily, metoprolol succinate 50 mg daily, terazosin 2 mg daily.

## 2022-07-26 NOTE — Assessment & Plan Note (Signed)
He had a potassium of 3.3 in the hospital.  He remains on thiazide diuretic.  Obtain follow-up BMET today.

## 2022-07-27 LAB — BASIC METABOLIC PANEL
BUN/Creatinine Ratio: 14 (ref 9–20)
BUN: 13 mg/dL (ref 6–24)
CO2: 23 mmol/L (ref 20–29)
Calcium: 9.4 mg/dL (ref 8.7–10.2)
Chloride: 101 mmol/L (ref 96–106)
Creatinine, Ser: 0.9 mg/dL (ref 0.76–1.27)
Glucose: 144 mg/dL — ABNORMAL HIGH (ref 70–99)
Potassium: 3.4 mmol/L — ABNORMAL LOW (ref 3.5–5.2)
Sodium: 143 mmol/L (ref 134–144)
eGFR: 99 mL/min/{1.73_m2} (ref >59)

## 2022-07-27 LAB — CBC
Hematocrit: 40.5 % (ref 37.5–51.0)
Hemoglobin: 13.7 g/dL (ref 13.0–17.7)
MCH: 30.1 pg (ref 26.6–33.0)
MCHC: 33.8 g/dL (ref 31.5–35.7)
MCV: 89 fL (ref 79–97)
Platelets: 208 10*3/uL (ref 150–450)
RBC: 4.55 x10E6/uL (ref 4.14–5.80)
RDW: 13 % (ref 11.6–15.4)
WBC: 5.1 10*3/uL (ref 3.4–10.8)

## 2022-07-29 ENCOUNTER — Telehealth: Payer: Self-pay | Admitting: *Deleted

## 2022-07-29 DIAGNOSIS — Z79899 Other long term (current) drug therapy: Secondary | ICD-10-CM

## 2022-07-29 MED ORDER — POTASSIUM CHLORIDE CRYS ER 20 MEQ PO TBCR: 20.0000 meq | EXTENDED_RELEASE_TABLET | Freq: Every day | ORAL | 3 refills | Status: DC

## 2022-07-29 NOTE — Telephone Encounter (Signed)
-----   Message from Beatrice Lecher, [place] sent at 07/28/2022 12:14 PM EDT ----- Creatinine normal.  K+ low.  Hgb normal. PLAN:  -Start K+ 20 mEq daily -BMET 1 week Tereso Newcomer, PA-C    07/28/2022 12:13 PM

## 2022-08-05 ENCOUNTER — Ambulatory Visit: Payer: BC Managed Care – PPO | Attending: Physician Assistant

## 2022-08-05 DIAGNOSIS — Z79899 Other long term (current) drug therapy: Secondary | ICD-10-CM

## 2022-08-06 LAB — BASIC METABOLIC PANEL
BUN/Creatinine Ratio: 18 (ref 9–20)
BUN: 17 mg/dL (ref 6–24)
CO2: 28 mmol/L (ref 20–29)
Calcium: 9.7 mg/dL (ref 8.7–10.2)
Chloride: 104 mmol/L (ref 96–106)
Creatinine, Ser: 0.96 mg/dL (ref 0.76–1.27)
Glucose: 138 mg/dL — ABNORMAL HIGH (ref 70–99)
Potassium: 3.6 mmol/L (ref 3.5–5.2)
Sodium: 143 mmol/L (ref 134–144)
eGFR: 92 mL/min/{1.73_m2} (ref >59)

## 2022-08-28 ENCOUNTER — Other Ambulatory Visit: Payer: Self-pay

## 2022-08-28 MED ORDER — POTASSIUM CHLORIDE CRYS ER 20 MEQ PO TBCR: 20.0000 meq | EXTENDED_RELEASE_TABLET | Freq: Every day | ORAL | 3 refills | Status: DC

## 2022-08-29 ENCOUNTER — Ambulatory Visit: Payer: BC Managed Care – PPO | Admitting: Family Medicine

## 2022-08-29 ENCOUNTER — Encounter: Payer: Self-pay | Admitting: Family Medicine

## 2022-08-29 VITALS — BP 152/90 | HR 76 | Wt 236.6 lb

## 2022-08-29 DIAGNOSIS — E1159 Type 2 diabetes mellitus with other circulatory complications: Secondary | ICD-10-CM

## 2022-08-29 DIAGNOSIS — E1169 Type 2 diabetes mellitus with other specified complication: Secondary | ICD-10-CM

## 2022-08-29 DIAGNOSIS — I4819 Other persistent atrial fibrillation: Secondary | ICD-10-CM | POA: Diagnosis not present

## 2022-08-29 DIAGNOSIS — Z7984 Long term (current) use of oral hypoglycemic drugs: Secondary | ICD-10-CM

## 2022-08-29 DIAGNOSIS — E785 Hyperlipidemia, unspecified: Secondary | ICD-10-CM

## 2022-08-29 DIAGNOSIS — E119 Type 2 diabetes mellitus without complications: Secondary | ICD-10-CM

## 2022-08-29 DIAGNOSIS — E669 Obesity, unspecified: Secondary | ICD-10-CM

## 2022-08-29 DIAGNOSIS — I152 Hypertension secondary to endocrine disorders: Secondary | ICD-10-CM

## 2022-08-29 LAB — POCT GLYCOSYLATED HEMOGLOBIN (HGB A1C): Hemoglobin A1C: 7.4 % — AB (ref 4.0–5.6)

## 2022-08-29 NOTE — Progress Notes (Signed)
   Subjective:    Patient ID: Marcus Holland, male    DOB: 07-11-1965, 57 y.o.   MRN: [id number]  HPI He is here for follow-up for his diabetes and atrial fibrillation. He was recently seen by cardiology.  That note was reviewed.  He is now taking [place] as well as Actos and metformin.  He did have some difficulty getting his medications through his mail order pharmacy.  He continues on metoprolol, amlodipine as well as atorvastatin and having no difficulty with these medications.  Work is going well.  Review of Systems     Objective:   Physical Exam Alert and in no distress.  Cardiac exam shows a regular rhythm.  Hemoglobin A1c is 7.4       Assessment & Plan:  Type 2 diabetes mellitus without complication, without long-term current use of insulin (HCC) - Plan: HgB A1c  Persistent atrial fibrillation (HCC)  Hypertension associated with diabetes (HCC)  Hyperlipidemia associated with type 2 diabetes mellitus (HCC)  Obesity (BMI 30-39.9) He is doing well on the present circumstances and I will plan to recheck him in 4 months.  Again encouraged him to be as active as possible.

## 2022-10-31 ENCOUNTER — Other Ambulatory Visit: Payer: Self-pay

## 2022-10-31 ENCOUNTER — Encounter: Payer: Self-pay | Admitting: Family Medicine

## 2022-10-31 DIAGNOSIS — I4891 Unspecified atrial fibrillation: Secondary | ICD-10-CM

## 2022-10-31 MED ORDER — RIVAROXABAN 20 MG PO TABS: 20.0000 mg | ORAL_TABLET | Freq: Every day | ORAL | 1 refills | Status: DC

## 2022-11-21 DIAGNOSIS — H2513 Age-related nuclear cataract, bilateral: Secondary | ICD-10-CM | POA: Diagnosis not present

## 2022-12-01 ENCOUNTER — Other Ambulatory Visit: Payer: Self-pay | Admitting: Family Medicine

## 2022-12-01 DIAGNOSIS — E119 Type 2 diabetes mellitus without complications: Secondary | ICD-10-CM

## 2023-01-21 ENCOUNTER — Ambulatory Visit (INDEPENDENT_AMBULATORY_CARE_PROVIDER_SITE_OTHER): Payer: BC Managed Care – PPO | Admitting: Family Medicine

## 2023-01-21 ENCOUNTER — Encounter: Payer: Self-pay | Admitting: Family Medicine

## 2023-01-21 VITALS — BP 140/90 | HR 74 | Wt 235.6 lb

## 2023-01-21 DIAGNOSIS — E785 Hyperlipidemia, unspecified: Secondary | ICD-10-CM

## 2023-01-21 DIAGNOSIS — E669 Obesity, unspecified: Secondary | ICD-10-CM | POA: Diagnosis not present

## 2023-01-21 DIAGNOSIS — I4819 Other persistent atrial fibrillation: Secondary | ICD-10-CM

## 2023-01-21 DIAGNOSIS — K219 Gastro-esophageal reflux disease without esophagitis: Secondary | ICD-10-CM

## 2023-01-21 DIAGNOSIS — E1169 Type 2 diabetes mellitus with other specified complication: Secondary | ICD-10-CM

## 2023-01-21 DIAGNOSIS — I152 Hypertension secondary to endocrine disorders: Secondary | ICD-10-CM

## 2023-01-21 DIAGNOSIS — E119 Type 2 diabetes mellitus without complications: Secondary | ICD-10-CM

## 2023-01-21 DIAGNOSIS — Z2821 Immunization not carried out because of patient refusal: Secondary | ICD-10-CM | POA: Insufficient documentation

## 2023-01-21 DIAGNOSIS — E1159 Type 2 diabetes mellitus with other circulatory complications: Secondary | ICD-10-CM | POA: Diagnosis not present

## 2023-01-21 LAB — POCT GLYCOSYLATED HEMOGLOBIN (HGB A1C): Hemoglobin A1C: 7.9 % — AB (ref 4.0–5.6)

## 2023-01-21 MED ORDER — TIRZEPATIDE 2.5 MG/0.5ML ~~LOC~~ SOAJ: 2.5000 mg | SUBCUTANEOUS | 1 refills | Status: DC

## 2023-01-21 NOTE — Progress Notes (Signed)
  Subjective:    Patient ID: Marcus Holland, male    DOB: 10-May-1965, 57 y.o.   MRN: [id number]  Marcus Holland is a 57 y.o. male who presents for follow-up of Type 2 diabetes mellitus.  Patient is checking home blood sugars.   Home blood sugar records: BGs range between 150 and 160 How often is blood sugars being checked: Twice, before dinner and 2 hours after dinner. At least 3 times a week. Current symptoms/problems include none and have been unchanged. Daily foot checks: yes   Any foot concerns: none Last eye exam: August 2024 Exercise: The patient has a physically strenuous job, but has no regular exercise apart from work.  He continues on Actos, metformin and [place].  He is having no difficulties with them.  He does state that he has loose stools but this has been that way for years.  Also continues on Xarelto, Hytrin, lisinopril/HCTZ, amlodipine.  Apparently the reflux is not causing much difficulty at the present time.  He is not interested in any immunizations. The following portions of the patient's history were reviewed and updated as appropriate: allergies, current medications, past medical history, past social history and problem list.  ROS as in subjective above.     Objective:    Physical Exam Alert and in no distress otherwise not examined.  Hemoglobin A1c is 7.9 Lab Review    Latest Ref Rng & Units 01/21/2023    3:26 PM 08/29/2022   11:14 AM 08/05/2022    8:07 AM 07/26/2022    8:55 AM 07/12/2022    7:19 AM  Diabetic Labs  HbA1c 4.0 - 5.6 % 7.9  7.4      Creatinine 0.76 - 1.27 mg/dL   4.09  8.11  9.14       01/21/2023    3:13 PM 08/29/2022   10:58 AM 07/26/2022    8:27 AM 07/12/2022    8:12 AM 07/12/2022    8:03 AM  BP/Weight  Systolic BP 140 152 126 140 124  Diastolic BP 90 90 68 88 83  Wt. (Lbs) 235.6 236.6 235.6    BMI 31.08 kg/m2 31.22 kg/m2 31.08 kg/m2        Latest Ref Rng & Units 03/14/2021    8:30 AM 10/31/2020   12:00 AM  Foot/eye exam completion dates  Eye Exam  No Retinopathy  No Retinopathy      Foot Form Completion  Done      This result is from an external source.    Marcus Holland  reports that he has never smoked. He has never used smokeless tobacco. He reports that he does not drink alcohol and does not use drugs.     Assessment & Plan:    Type 2 diabetes mellitus without complication, without long-term current use of insulin (HCC) - Plan: POCT glycosylated hemoglobin (Hb A1C), tirzepatide (MOUNJARO) 2.5 MG/0.5ML Pen  Persistent atrial fibrillation (HCC)  Obesity (BMI 30-39.9)  Hypertension associated with diabetes (HCC)  Hyperlipidemia associated with type 2 diabetes mellitus (HCC)  Gastroesophageal reflux disease without esophagitis  Immunization refused  I will stop the Actos and switch him to [facility].  We will working to get good coverage for him.  Discussed possible side effects mainly all GI related and he will to call me in 1 month to let me know how he is doing.  Otherwise he is to continue on his present medication regimen.

## 2023-03-17 ENCOUNTER — Encounter: Payer: Self-pay | Admitting: Family Medicine

## 2023-03-27 ENCOUNTER — Other Ambulatory Visit: Payer: Self-pay | Admitting: Family Medicine

## 2023-03-27 DIAGNOSIS — E119 Type 2 diabetes mellitus without complications: Secondary | ICD-10-CM

## 2023-03-28 ENCOUNTER — Other Ambulatory Visit: Payer: Self-pay

## 2023-03-28 DIAGNOSIS — E119 Type 2 diabetes mellitus without complications: Secondary | ICD-10-CM

## 2023-03-28 MED ORDER — MOUNJARO 2.5 MG/0.5ML ~~LOC~~ SOAJ: 2.5000 mg | SUBCUTANEOUS | 2 refills | Status: DC

## 2023-05-10 ENCOUNTER — Other Ambulatory Visit: Payer: Self-pay | Admitting: Family Medicine

## 2023-05-10 DIAGNOSIS — I4891 Unspecified atrial fibrillation: Secondary | ICD-10-CM

## 2023-05-29 NOTE — Progress Notes (Signed)
 Cardiology Office Note:    Date:  05/30/2023  ID:  Marcus, Holland [birth year], MRN [id number] PCP: Joyce Norleen BROCKS, MD  [facility] Providers Cardiologist:  Candyce Reek, MD       Patient Profile:      Persistent atrial fibrillation TTE 06/27/2022: EF 50-55, no RWMA, NL RVSF, trivial MR, borderline aorta (39 mm) Diabetes mellitus  Hyperlipidemia  Hypertension  Obesity  FHx of CAD      Marcus Holland is a 58 y.o. male who returns for follow up of AFib. He was last seen in 07/2022 after undergoing elective DCCV for AFib.   Discussed the use of AI scribe software for clinical note transcription with the patient, who gave verbal consent to proceed.  History of Present Illness   He is currently asymptomatic with no chest pain, pressure, or shortness of breath. No awareness of heart racing or irregular heartbeats. He is taking metoprolol  and confirms taking it today. He experiences dizziness when bending down and standing up but denies any episodes of passing out or feeling close to it. He had a sleep study between 2003 and 2007 in [place] , which indicated that his sleep apnea was not severe enough to require CPAP treatment. He acknowledges snoring but does not feel excessively tired during the day, attributing any tiredness to lack of sleep rather than sleep apnea. He has not had witnessed apnea.      Review of Systems  Gastrointestinal:  Negative for hematochezia and melena.  Genitourinary:  Negative for hematuria.  -See HPI    Studies Reviewed:   EKG Interpretation Date/Time:  Friday May 30 2023 08:58:43 EST Ventricular Rate:  104 PR Interval:    QRS Duration:  86 QT Interval:  348 QTC Calculation: 457 R Axis:   1  Text Interpretation: Atrial fibrillation with rapid ventricular response Nonspecific T wave abnormality Confirmed by Lelon Hamilton ([id number]) on 05/30/2023 9:12:56 AM   Results    Risk Assessment/Calculations:    CHA2DS2-VASc Score = 2   This indicates a  2.2% annual risk of stroke. The patient's score is based upon: CHF History: 0 HTN History: 1 Diabetes History: 1 Stroke History: 0 Vascular Disease History: 0 Age Score: 0 Gender Score: 0            Physical Exam:   VS:  BP 118/76   Pulse (!) 104   Ht 6' 1 (1.854 m)   Wt 223 lb 12.8 oz (101.5 kg)   SpO2 97%   BMI 29.53 kg/m    Wt Readings from Last 3 Encounters:  05/30/23 223 lb 12.8 oz (101.5 kg)  01/21/23 235 lb 9.6 oz (106.9 kg)  08/29/22 236 lb 9.6 oz (107.3 kg)    Constitutional:      Appearance: Healthy appearance. Not in distress.  Neck:     Vascular: JVD normal.  Pulmonary:     Breath sounds: Normal breath sounds. No wheezing. No rales.  Cardiovascular:     Normal rate. Irregularly irregular rhythm.     Murmurs: There is no murmur.  Edema:    Peripheral edema absent.  Abdominal:     Palpations: Abdomen is soft.        Assessment and Plan:   Assessment & Plan Persistent atrial fibrillation (HCC) Recurrent atrial fibrillation noted on today's EKG. He is asymptomatic with no chest pain, dyspnea, or syncope. Heart rate elevated at 104 bpm. We discussed the importance of rhythm control. His atria were normal size last  year. He will likely be able to maintain NSR. He has no hx of CAD or structural heart disease. He is also young and I think he is a good candidate for PVI ablation. I have recommended that we start Flecainide  to restore NSR and then refer to EP for consideration of ablation. The patient agrees with this plan. I also reviewed his case with Dr. Alveta (DOD) who agreed.  - Start flecainide  75 mg twice daily - Continue metoprolol  XL 50 mg daily - Continue Xarelto  20 mg daily (he has not missed a dose) - Follow up in AFib clinic in 1-2 weeks to arrange cardioversion if still in AFib - If sinus rhythm is restored and he is to continue on flecainide , he will need follow up stress testing to r/o proarrhythmia (would suggest Exercise Myoview ) - Refer to  electrophysiologist for consideration of ablation Hypertension associated with diabetes (HCC) BP controlled.  Continue amlodipine  10 mg daily, lisinopril /HCTZ 20/12.5 mg daily, metoprolol  succinate 50 mg daily, terazosin  2 mg daily Hyperlipidemia associated with type 2 diabetes mellitus (HCC) LDL in February 2024 was optimal at 65 according to [facility].  Continue Lipitor 20 mg daily.      Dispo:  Return in about 6 months (around 11/27/2023) for Routine Follow Up, w/ Glendia Ferrier, PA-C.  Signed, Glendia Ferrier, PA-C

## 2023-05-30 ENCOUNTER — Encounter: Payer: Self-pay | Admitting: Physician Assistant

## 2023-05-30 ENCOUNTER — Ambulatory Visit: Payer: BC Managed Care – PPO | Attending: Physician Assistant | Admitting: Physician Assistant

## 2023-05-30 VITALS — BP 118/76 | HR 104 | Ht 73.0 in | Wt 223.8 lb

## 2023-05-30 DIAGNOSIS — E1169 Type 2 diabetes mellitus with other specified complication: Secondary | ICD-10-CM | POA: Diagnosis not present

## 2023-05-30 DIAGNOSIS — E785 Hyperlipidemia, unspecified: Secondary | ICD-10-CM

## 2023-05-30 DIAGNOSIS — I152 Hypertension secondary to endocrine disorders: Secondary | ICD-10-CM | POA: Diagnosis not present

## 2023-05-30 DIAGNOSIS — I4819 Other persistent atrial fibrillation: Secondary | ICD-10-CM

## 2023-05-30 DIAGNOSIS — E1159 Type 2 diabetes mellitus with other circulatory complications: Secondary | ICD-10-CM

## 2023-05-30 MED ORDER — FLECAINIDE ACETATE 50 MG PO TABS: 75.0000 mg | ORAL_TABLET | Freq: Two times a day (BID) | ORAL | 3 refills | Status: DC

## 2023-05-30 NOTE — Assessment & Plan Note (Signed)
 BP controlled.  Continue amlodipine  10 mg daily, lisinopril /HCTZ 20/12.5 mg daily, metoprolol  succinate 50 mg daily, terazosin  2 mg daily

## 2023-05-30 NOTE — Assessment & Plan Note (Signed)
 LDL in February 2024 was optimal at 63 according to [facility].  Continue Lipitor 20 mg daily.

## 2023-05-30 NOTE — Patient Instructions (Signed)
 Medication Instructions:  Your physician has recommended you make the following change in your medication:   START Flecainide  50 mg taking  1 1/2 tablets twice a day  *If you need a refill on your cardiac medications before your next appointment, please call your pharmacy*   Lab Work: PLEASE HAVE DR. LALONDE DRAW A BMET & CBC  If you have labs (blood work) drawn today and your tests are completely normal, you will receive your results only by: MyChart Message (if you have MyChart) OR A paper copy in the mail If you have any lab test that is abnormal or we need to change your treatment, we will call you to review the results.   Testing/Procedures: None ordered  You have been referred to ELECTROPHYSIOLOGY TO DISCUSS ABLATION.  1ST AVAILABLE  Your physician recommends that you schedule a follow-up appointment in: 1-2 WEEKS IN THE AFIB CLINIC TO SET UP CARDIOVERSION   Follow-Up: At [facility], you and your health needs are our priority.  As part of our continuing mission to provide you with exceptional heart care, we have created designated Provider Care Teams.  These Care Teams include your primary Cardiologist (physician) and Advanced Practice Providers (APPs -  Physician Assistants and Nurse Practitioners) who all work together to provide you with the care you need, when you need it.  We recommend signing up for the patient portal called MyChart.  Sign up information is provided on this After Visit Summary.  MyChart is used to connect with patients for Virtual Visits (Telemedicine).  Patients are able to view lab/test results, encounter notes, upcoming appointments, etc.  Non-urgent messages can be sent to your provider as well.   To learn more about what you can do with MyChart, go to [email or web address].    Your next appointment:   6 month(s)  Provider:   Glendia Ferrier, PA-C         Other Instructions   1st Floor: - Lobby - Registration  - Pharmacy  -  Lab - Cafe  2nd Floor: - PV Lab - Diagnostic Testing (echo, CT, nuclear med)  3rd Floor: - Vacant  4th Floor: - TCTS (cardiothoracic surgery) - AFib Clinic - Structural Heart Clinic - Vascular Surgery  - Vascular Ultrasound  5th Floor: - HeartCare Cardiology (general and EP) - Clinical Pharmacy for coumadin, hypertension, lipid, weight-loss medications, and med management appointments    Valet parking services will be available as well.

## 2023-05-30 NOTE — Assessment & Plan Note (Signed)
 Recurrent atrial fibrillation noted on today's EKG. He is asymptomatic with no chest pain, dyspnea, or syncope. Heart rate elevated at 104 bpm. We discussed the importance of rhythm control. His atria were normal size last year. He will likely be able to maintain NSR. He has no hx of CAD or structural heart disease. He is also young and I think he is a good candidate for PVI ablation. I have recommended that we start Flecainide  to restore NSR and then refer to EP for consideration of ablation. The patient agrees with this plan. I also reviewed his case with Dr. Alveta (DOD) who agreed.  - Start flecainide  75 mg twice daily - Continue metoprolol  XL 50 mg daily - Continue Xarelto  20 mg daily (he has not missed a dose) - Follow up in AFib clinic in 1-2 weeks to arrange cardioversion if still in AFib - If sinus rhythm is restored and he is to continue on flecainide , he will need follow up stress testing to r/o proarrhythmia (would suggest Exercise Myoview ) - Refer to electrophysiologist for consideration of ablation

## 2023-06-02 ENCOUNTER — Other Ambulatory Visit: Payer: Self-pay | Admitting: Family Medicine

## 2023-06-02 DIAGNOSIS — E119 Type 2 diabetes mellitus without complications: Secondary | ICD-10-CM

## 2023-06-03 ENCOUNTER — Telehealth: Payer: Self-pay

## 2023-06-03 ENCOUNTER — Other Ambulatory Visit: Payer: Self-pay | Admitting: Family Medicine

## 2023-06-03 ENCOUNTER — Ambulatory Visit: Payer: BC Managed Care – PPO | Admitting: Family Medicine

## 2023-06-03 ENCOUNTER — Encounter: Payer: Self-pay | Admitting: Family Medicine

## 2023-06-03 VITALS — BP 126/78 | HR 58 | Ht 73.0 in | Wt 223.0 lb

## 2023-06-03 DIAGNOSIS — E1159 Type 2 diabetes mellitus with other circulatory complications: Secondary | ICD-10-CM

## 2023-06-03 DIAGNOSIS — I152 Hypertension secondary to endocrine disorders: Secondary | ICD-10-CM

## 2023-06-03 DIAGNOSIS — I4819 Other persistent atrial fibrillation: Secondary | ICD-10-CM

## 2023-06-03 DIAGNOSIS — E669 Obesity, unspecified: Secondary | ICD-10-CM

## 2023-06-03 DIAGNOSIS — E1122 Type 2 diabetes mellitus with diabetic chronic kidney disease: Secondary | ICD-10-CM

## 2023-06-03 DIAGNOSIS — E785 Hyperlipidemia, unspecified: Secondary | ICD-10-CM

## 2023-06-03 DIAGNOSIS — Z2821 Immunization not carried out because of patient refusal: Secondary | ICD-10-CM

## 2023-06-03 DIAGNOSIS — K219 Gastro-esophageal reflux disease without esophagitis: Secondary | ICD-10-CM | POA: Diagnosis not present

## 2023-06-03 DIAGNOSIS — E1169 Type 2 diabetes mellitus with other specified complication: Secondary | ICD-10-CM

## 2023-06-03 DIAGNOSIS — N1831 Chronic kidney disease, stage 3a: Secondary | ICD-10-CM | POA: Diagnosis not present

## 2023-06-03 LAB — POCT GLYCOSYLATED HEMOGLOBIN (HGB A1C): Hemoglobin A1C: 5.9 % — AB (ref 4.0–5.6)

## 2023-06-03 NOTE — Progress Notes (Signed)
   Subjective:    Patient ID: Marcus Holland, male    DOB: 03/30/1966, 58 y.o.   MRN: [id number]  HPI He is here for follow-up on his diabetes.  He is now taking 0.25 mg of Mounjaro, Farxiga, metformin.  He is having no difficulty with these medications but does state that he has had difficulty with loose stool since he has had gallbladder surgery.  His atrial fib has kicked again and he is about to start taking flecainide.  He is scheduled for follow-up at the A-fib clinic.  He is on Xarelto.  Continues on lisinopril/Norvasc and metoprolol.   Review of Systems     Objective:    Physical Exam Alert and in no distress.  Exam of the record shows a 12 pound weight loss.  Hemoglobin A1c is 5.9.       Assessment & Plan:  Type 2 diabetes mellitus with stage 3a chronic kidney disease, without long-term current use of insulin (HCC) - Plan: POCT glycosylated hemoglobin (Hb A1C)  Gastroesophageal reflux disease without esophagitis  Immunization refused  Hypertension associated with diabetes (HCC)  Hyperlipidemia associated with type 2 diabetes mellitus (HCC)  Obesity (BMI 30-39.9)  Persistent atrial fibrillation (HCC) He will continue to follow-up with cardiology.  I will reduce his metformin to 1 pill/day and possibly stop this if he continues to have good hemoglobin A1c readings.  May need to readjust some of his blood pressure medications based on cardiac status and weight/blood pressure readings.  Recheck here in 4 months.

## 2023-06-03 NOTE — Telephone Encounter (Signed)
Pt has a physical on 09/30/23 and labs on 09/29/23. Will you put in his orders?

## 2023-06-06 ENCOUNTER — Other Ambulatory Visit: Payer: Self-pay | Admitting: Family Medicine

## 2023-06-06 DIAGNOSIS — E119 Type 2 diabetes mellitus without complications: Secondary | ICD-10-CM

## 2023-06-09 ENCOUNTER — Other Ambulatory Visit: Payer: Self-pay

## 2023-06-16 ENCOUNTER — Ambulatory Visit (HOSPITAL_COMMUNITY)
Admission: RE | Admit: 2023-06-16 | Discharge: 2023-06-16 | Disposition: A | Discharge status: Home / Self Care | Payer: BC Managed Care – PPO | Source: Ambulatory Visit | Attending: Internal Medicine | Admitting: Internal Medicine

## 2023-06-16 ENCOUNTER — Other Ambulatory Visit (HOSPITAL_COMMUNITY): Payer: Self-pay | Admitting: *Deleted

## 2023-06-16 VITALS — BP 130/86 | HR 96 | Ht 73.0 in | Wt 223.4 lb

## 2023-06-16 DIAGNOSIS — E785 Hyperlipidemia, unspecified: Secondary | ICD-10-CM | POA: Diagnosis not present

## 2023-06-16 DIAGNOSIS — Z7984 Long term (current) use of oral hypoglycemic drugs: Secondary | ICD-10-CM | POA: Insufficient documentation

## 2023-06-16 DIAGNOSIS — I4819 Other persistent atrial fibrillation: Secondary | ICD-10-CM | POA: Diagnosis not present

## 2023-06-16 DIAGNOSIS — Z5181 Encounter for therapeutic drug level monitoring: Secondary | ICD-10-CM | POA: Diagnosis not present

## 2023-06-16 DIAGNOSIS — I10 Essential (primary) hypertension: Secondary | ICD-10-CM | POA: Insufficient documentation

## 2023-06-16 DIAGNOSIS — D6869 Other thrombophilia: Secondary | ICD-10-CM | POA: Diagnosis not present

## 2023-06-16 DIAGNOSIS — R9431 Abnormal electrocardiogram [ECG] [EKG]: Secondary | ICD-10-CM | POA: Diagnosis not present

## 2023-06-16 DIAGNOSIS — Z7901 Long term (current) use of anticoagulants: Secondary | ICD-10-CM | POA: Diagnosis not present

## 2023-06-16 DIAGNOSIS — E119 Type 2 diabetes mellitus without complications: Secondary | ICD-10-CM | POA: Insufficient documentation

## 2023-06-16 DIAGNOSIS — Z79899 Other long term (current) drug therapy: Secondary | ICD-10-CM | POA: Diagnosis not present

## 2023-06-16 LAB — CBC
HCT: 40.9 % (ref 39.0–52.0)
Hemoglobin: 14.2 g/dL (ref 13.0–17.0)
MCH: 30.4 pg (ref 26.0–34.0)
MCHC: 34.7 g/dL (ref 30.0–36.0)
MCV: 87.6 fL (ref 80.0–100.0)
Platelets: 223 10*3/uL (ref 150–400)
RBC: 4.67 MIL/uL (ref 4.22–5.81)
RDW: 13.2 % (ref 11.5–15.5)
WBC: 5.3 10*3/uL (ref 4.0–10.5)
nRBC: 0 % (ref 0.0–0.2)

## 2023-06-16 LAB — BASIC METABOLIC PANEL
Anion gap: 9 (ref 5–15)
BUN: 14 mg/dL (ref 6–20)
CO2: 25 mmol/L (ref 22–32)
Calcium: 9.2 mg/dL (ref 8.9–10.3)
Chloride: 104 mmol/L (ref 98–111)
Creatinine, Ser: 0.99 mg/dL (ref 0.61–1.24)
GFR, Estimated: >60 mL/min (ref >60)
Glucose, Bld: 139 mg/dL — ABNORMAL HIGH (ref 70–99)
Potassium: 3.3 mmol/L — ABNORMAL LOW (ref 3.5–5.1)
Sodium: 138 mmol/L (ref 135–145)

## 2023-06-16 MED ORDER — FLECAINIDE ACETATE 100 MG PO TABS: 100.0000 mg | ORAL_TABLET | Freq: Two times a day (BID) | ORAL | Status: DC

## 2023-06-16 MED ORDER — POTASSIUM CHLORIDE CRYS ER 10 MEQ PO TBCR: 30.0000 meq | EXTENDED_RELEASE_TABLET | Freq: Every day | ORAL | 3 refills | Status: DC

## 2023-06-16 NOTE — Progress Notes (Signed)
 Primary Care Physician: Ronnald Nian, MD Primary Cardiologist: Lance Muss, MD Electrophysiologist: None     Referring Physician: Tereso Newcomer, PA-C     Marcus Holland is a 58 y.o. male with a history of HTN, T2DM, HLD, and persistent atrial fibrillation who presents for consultation in the [facility]. Seen by Tereso Newcomer, PA-C, on 05/30/23 and noted to be in Afib. Started patient on flecainide 75 mg BID. Patient is on Xarelto for a CHADS2VASC score of 2.  On evaluation today, he is currently in Afib. He does not have cardiac awareness but does note fatigue. No missed doses of flecainide 75 mg BID or Xarelto. He took [place] last Saturday.   Today, he denies symptoms of palpitations, chest pain, shortness of breath, orthopnea, PND, lower extremity edema, dizziness, presyncope, syncope, snoring, daytime somnolence, bleeding, or neurologic sequela. The patient is tolerating medications without difficulties and is otherwise without complaint today.    Atrial Fibrillation Risk Factors:  He had a sleep study in early 2000s that noted OSA which did not require CPAP per report.  he has a BMI of Body mass index is 29.47 kg/m.Marland Kitchen Filed Weights   06/16/23 0902  Weight: 101.3 kg    Current Outpatient Medications  Medication Sig Dispense Refill   amLODipine (NORVASC) 10 MG tablet Take 1 tablet (10 mg total) by mouth daily. 90 tablet 3   Ascorbic Acid (VITAMIN C PO) Take 1 tablet by mouth daily.     atorvastatin (LIPITOR) 20 MG tablet Take 1 tablet (20 mg total) by mouth daily. 90 tablet 3   cholecalciferol (VITAMIN D3) 25 MCG (1000 UNIT) tablet Take 1,000 Units by mouth daily.     FARXIGA 10 MG TABS tablet TAKE 1 TABLET(10 MG) BY MOUTH DAILY BEFORE BREAKFAST 90 tablet 1   flecainide (TAMBOCOR) 50 MG tablet Take 1.5 tablets (75 mg total) by mouth 2 (two) times daily. 270 tablet 3   KRILL OIL PO Take 1 capsule by mouth daily.      lisinopril-hydrochlorothiazide (ZESTORETIC) 20-12.5 MG tablet TAKE 1 TABLET DAILY 90 tablet 1   metFORMIN (GLUCOPHAGE) 1000 MG tablet TAKE 1 TABLET TWICE A DAY WITH MEALS (Patient taking differently: Take 1,000 mg by mouth daily with breakfast.) 180 tablet 1   metoprolol succinate (TOPROL-XL) 50 MG 24 hr tablet Take 1 tablet (50 mg total) by mouth daily. Take with or immediately following a meal. 90 tablet 3   Multiple Vitamins-Minerals (MULTIVITAMIN WITH MINERALS) tablet Take 1 tablet by mouth daily.     Multiple Vitamins-Minerals (PRESERVISION AREDS 2+MULTI VIT PO) Take 1 capsule by mouth in the morning and at bedtime.     potassium chloride SA (KLOR-CON M) 20 MEQ tablet Take 1 tablet (20 mEq total) by mouth daily. 90 tablet 3   terazosin (HYTRIN) 2 MG capsule TAKE 1 CAPSULE BY MOUTH EACH NIGHT AT BEDTIME 90 capsule 3   tirzepatide (MOUNJARO) 2.5 MG/0.5ML Pen Inject 2.5 mg into the skin once a week. 2 mL 2   XARELTO 20 MG TABS tablet TAKE 1 TABLET(20 MG) BY MOUTH DAILY WITH SUPPER 90 tablet 1   zinc gluconate 50 MG tablet Take 50 mg by mouth daily.     No current facility-administered medications for this encounter.    Atrial Fibrillation Management history:  Previous antiarrhythmic drugs: flecainide Previous cardioversions: none Previous ablations: none Anticoagulation history: Xarelto   ROS- All systems are reviewed and negative except as per the HPI above.  Physical  Exam: BP 130/86   Pulse 96   Ht 6\' 1"  (1.854 m)   Wt 101.3 kg   BMI 29.47 kg/m   GEN: Well nourished, well developed in no acute distress NECK: No JVD; No carotid bruits CARDIAC: Irregularly irregular rate and rhythm, no murmurs, rubs, gallops RESPIRATORY:  Clear to auscultation without rales, wheezing or rhonchi  ABDOMEN: Soft, non-tender, non-distended EXTREMITIES:  No edema; No deformity   EKG today demonstrates  Vent. rate 96 BPM PR interval * ms QRS duration 90 ms QT/QTcB 388/490 ms P-R-T axes * 15  -18 Atrial fibrillation Nonspecific ST and T wave abnormality Prolonged QT Abnormal ECG When compared with ECG of 30-May-2023 08:58, PREVIOUS ECG IS PRESENT  Echo 06/27/22 demonstrated   1. Left ventricular ejection fraction, by estimation, is 50 to 55%. Left  ventricular ejection fraction by 2D MOD biplane is 51.1 %. The left  ventricle has low normal function. The left ventricle has no regional wall  motion abnormalities. Left  ventricular diastolic function could not be evaluated.   2. Right ventricular systolic function is normal. The right ventricular  size is normal. Tricuspid regurgitation signal is inadequate for assessing  PA pressure.   3. The mitral valve is grossly normal. Trivial mitral valve  regurgitation.   4. The aortic valve is tricuspid. Aortic valve regurgitation is not  visualized.   5. Aortic dilatation noted. There is borderline dilatation of the  ascending aorta, measuring 39 mm.   ASSESSMENT & PLAN CHA2DS2-VASc Score = 2  The patient's score is based upon: CHF History: 0 HTN History: 1 Diabetes History: 1 Stroke History: 0 Vascular Disease History: 0 Age Score: 0 Gender Score: 0       ASSESSMENT AND PLAN: Persistent Atrial Fibrillation (ICD10:  I48.19) The patient's CHA2DS2-VASc score is 2, indicating a 2.2% annual risk of stroke.    He is currently in Afib. We discussed treatment options for this which would include procedure of cardioversion to try to convert him to NSR. After discussion, patient agrees to proceed with cardioversion. Labs drawn today. 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 He will have to hold his Marcelline Deist 3 days prior to DCCV.   Informed Consent   Shared Decision Making/Informed Consent The risks (stroke, cardiac arrhythmias rarely resulting in the need for a temporary or permanent pacemaker, skin irritation or burns and complications associated with conscious sedation including aspiration, arrhythmia,  respiratory failure and death), benefits (restoration of normal sinus rhythm) and alternatives of a direct current cardioversion were explained in detail to [name] and he agrees to proceed.      High risk medication monitoring (ICD10: R7229428) Patient requires ongoing monitoring for anti-arrhythmic medication which has the potential to cause life threatening arrhythmias or AV block. ECG intervals are stable. He is still in Afib. Will increase flecainide to 100 mg BID.  Schedule exercise myoview test.   Secondary Hypercoagulable State (ICD10:  D68.69) The patient is at significant risk for stroke/thromboembolism based upon his CHA2DS2-VASc Score of 2.  Continue Rivaroxaban (Xarelto).  No missed doses of Xarelto.     Follow up as scheduled with Dr. Nelly Laurence after cardioversion.   Lake Bells, PA-C  Afib Clinic [facility] [address] [place], [place] [address] [phone]

## 2023-06-16 NOTE — H&P (View-Only) (Signed)
 Primary Care Physician: Marcus Nian, MD Primary Cardiologist: Marcus Muss, MD Electrophysiologist: None     Referring Physician: Tereso Newcomer, PA-C     Marcus Holland is a 58 y.o. male with a history of HTN, T2DM, HLD, and persistent atrial fibrillation who presents for consultation in the [facility]. Seen by Marcus Newcomer, PA-C, on 05/30/23 and noted to be in Afib. Started patient on flecainide 75 mg BID. Patient is on Xarelto for a CHADS2VASC score of 2.  On evaluation today, he is currently in Afib. He does not have cardiac awareness but does note fatigue. No missed doses of flecainide 75 mg BID or Xarelto. He took [place] last Saturday.   Today, he denies symptoms of palpitations, chest pain, shortness of breath, orthopnea, PND, lower extremity edema, dizziness, presyncope, syncope, snoring, daytime somnolence, bleeding, or neurologic sequela. The patient is tolerating medications without difficulties and is otherwise without complaint today.    Atrial Fibrillation Risk Factors:  He had a sleep study in early 2000s that noted OSA which did not require CPAP per report.  he has a BMI of Body mass index is 29.47 kg/m.Marland Kitchen Filed Weights   06/16/23 0902  Weight: 101.3 kg    Current Outpatient Medications  Medication Sig Dispense Refill   amLODipine (NORVASC) 10 MG tablet Take 1 tablet (10 mg total) by mouth daily. 90 tablet 3   Ascorbic Acid (VITAMIN C PO) Take 1 tablet by mouth daily.     atorvastatin (LIPITOR) 20 MG tablet Take 1 tablet (20 mg total) by mouth daily. 90 tablet 3   cholecalciferol (VITAMIN D3) 25 MCG (1000 UNIT) tablet Take 1,000 Units by mouth daily.     FARXIGA 10 MG TABS tablet TAKE 1 TABLET(10 MG) BY MOUTH DAILY BEFORE BREAKFAST 90 tablet 1   flecainide (TAMBOCOR) 50 MG tablet Take 1.5 tablets (75 mg total) by mouth 2 (two) times daily. 270 tablet 3   KRILL OIL PO Take 1 capsule by mouth daily.      lisinopril-hydrochlorothiazide (ZESTORETIC) 20-12.5 MG tablet TAKE 1 TABLET DAILY 90 tablet 1   metFORMIN (GLUCOPHAGE) 1000 MG tablet TAKE 1 TABLET TWICE A DAY WITH MEALS (Patient taking differently: Take 1,000 mg by mouth daily with breakfast.) 180 tablet 1   metoprolol succinate (TOPROL-XL) 50 MG 24 hr tablet Take 1 tablet (50 mg total) by mouth daily. Take with or immediately following a meal. 90 tablet 3   Multiple Vitamins-Minerals (MULTIVITAMIN WITH MINERALS) tablet Take 1 tablet by mouth daily.     Multiple Vitamins-Minerals (PRESERVISION AREDS 2+MULTI VIT PO) Take 1 capsule by mouth in the morning and at bedtime.     potassium chloride SA (KLOR-CON M) 20 MEQ tablet Take 1 tablet (20 mEq total) by mouth daily. 90 tablet 3   terazosin (HYTRIN) 2 MG capsule TAKE 1 CAPSULE BY MOUTH EACH NIGHT AT BEDTIME 90 capsule 3   tirzepatide (MOUNJARO) 2.5 MG/0.5ML Pen Inject 2.5 mg into the skin once a week. 2 mL 2   XARELTO 20 MG TABS tablet TAKE 1 TABLET(20 MG) BY MOUTH DAILY WITH SUPPER 90 tablet 1   zinc gluconate 50 MG tablet Take 50 mg by mouth daily.     No current facility-administered medications for this encounter.    Atrial Fibrillation Management history:  Previous antiarrhythmic drugs: flecainide Previous cardioversions: none Previous ablations: none Anticoagulation history: Xarelto   ROS- All systems are reviewed and negative except as per the HPI above.  Physical  Exam: BP 130/86   Pulse 96   Ht 6\' 1"  (1.854 m)   Wt 101.3 kg   BMI 29.47 kg/m   GEN: Well nourished, well developed in no acute distress NECK: No JVD; No carotid bruits CARDIAC: Irregularly irregular rate and rhythm, no murmurs, rubs, gallops RESPIRATORY:  Clear to auscultation without rales, wheezing or rhonchi  ABDOMEN: Soft, non-tender, non-distended EXTREMITIES:  No edema; No deformity   EKG today demonstrates  Vent. rate 96 BPM PR interval * ms QRS duration 90 ms QT/QTcB 388/490 ms P-R-T axes * 15  -18 Atrial fibrillation Nonspecific ST and T wave abnormality Prolonged QT Abnormal ECG When compared with ECG of 30-May-2023 08:58, PREVIOUS ECG IS PRESENT  Echo 06/27/22 demonstrated   1. Left ventricular ejection fraction, by estimation, is 50 to 55%. Left  ventricular ejection fraction by 2D MOD biplane is 51.1 %. The left  ventricle has low normal function. The left ventricle has no regional wall  motion abnormalities. Left  ventricular diastolic function could not be evaluated.   2. Right ventricular systolic function is normal. The right ventricular  size is normal. Tricuspid regurgitation signal is inadequate for assessing  PA pressure.   3. The mitral valve is grossly normal. Trivial mitral valve  regurgitation.   4. The aortic valve is tricuspid. Aortic valve regurgitation is not  visualized.   5. Aortic dilatation noted. There is borderline dilatation of the  ascending aorta, measuring 39 mm.   ASSESSMENT & PLAN CHA2DS2-VASc Score = 2  The patient's score is based upon: CHF History: 0 HTN History: 1 Diabetes History: 1 Stroke History: 0 Vascular Disease History: 0 Age Score: 0 Gender Score: 0       ASSESSMENT AND PLAN: Persistent Atrial Fibrillation (ICD10:  I48.19) The patient's CHA2DS2-VASc score is 2, indicating a 2.2% annual risk of stroke.    He is currently in Afib. We discussed treatment options for this which would include procedure of cardioversion to try to convert him to NSR. After discussion, patient agrees to proceed with cardioversion. Labs drawn today. 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 He will have to hold his Marcelline Deist 3 days prior to DCCV.   Informed Consent   Shared Decision Making/Informed Consent The risks (stroke, cardiac arrhythmias rarely resulting in the need for a temporary or permanent pacemaker, skin irritation or burns and complications associated with conscious sedation including aspiration, arrhythmia,  respiratory failure and death), benefits (restoration of normal sinus rhythm) and alternatives of a direct current cardioversion were explained in detail to Marcus Holland and he agrees to proceed.      High risk medication monitoring (ICD10: R7229428) Patient requires ongoing monitoring for anti-arrhythmic medication which has the potential to cause life threatening arrhythmias or AV block. ECG intervals are stable. He is still in Afib. Will increase flecainide to 100 mg BID.  Schedule exercise myoview test.   Secondary Hypercoagulable State (ICD10:  D68.69) The patient is at significant risk for stroke/thromboembolism based upon his CHA2DS2-VASc Score of 2.  Continue Rivaroxaban (Xarelto).  No missed doses of Xarelto.     Follow up as scheduled with Dr. Nelly Laurence after cardioversion.   Lake Bells, PA-C  Afib Clinic [facility] [address] [place], [place] [address] [phone]

## 2023-06-16 NOTE — Patient Instructions (Signed)
 Increase flecainide to 100mg  twice a day     Cardioversion scheduled for: Monday, March 3rd   - Arrive at the [facility] and go to admitting at 830am   - Do not eat or drink anything after midnight the night prior to your procedure.   - Take all your morning medication (except diabetic medications) with a sip of water prior to arrival.  - You will not be able to drive home after your procedure.    - Do NOT miss any doses of your blood thinner - if you should miss a dose please notify our office immediately.   - If you feel as if you go back into normal rhythm prior to scheduled cardioversion, please notify our office immediately.   If your procedure is canceled in the cardioversion suite you will be charged a cancellation fee.    Hold below medications 7 days prior to scheduled procedure/anesthesia.  Restart medication on the normal dosing day after scheduled procedure/anesthesia   Tirzepatide Greggory Keen)     Hold below medications 72 hours prior to scheduled procedure/anesthesia. Restart medication on the following day after scheduled procedure/anesthesia  Dapagliflozin Marcelline Deist)    For those patients who have a scheduled procedure/anesthesia on the same day of the week as their dose, hold the medication on the day of surgery.  They can take their scheduled dose the week before.  **Patients on the above medications scheduled for elective procedures that have not held the medication for the appropriate amount of time are at risk of cancellation or change in the anesthetic plan.

## 2023-06-17 ENCOUNTER — Encounter: Payer: Self-pay | Admitting: Internal Medicine

## 2023-06-19 ENCOUNTER — Other Ambulatory Visit: Payer: Self-pay | Admitting: Family Medicine

## 2023-06-19 DIAGNOSIS — E119 Type 2 diabetes mellitus without complications: Secondary | ICD-10-CM

## 2023-06-20 NOTE — Progress Notes (Signed)
 Pt called for pre procedure instructions. Arrival time 0830 NPO after midnight explained Instructed to take am meds with sip of water and confirmed blood thinner consistency.  Pt reports holding Monjaro since 2/22 and Farxiga 2/28 Instructed pt need for ride home tomorrow and have responsible adult with them for 24 hrs post procedure.

## 2023-06-23 ENCOUNTER — Ambulatory Visit (HOSPITAL_COMMUNITY): Admitting: Anesthesiology

## 2023-06-23 ENCOUNTER — Encounter (HOSPITAL_COMMUNITY)
Admission: RE | Disposition: A | Discharge status: Home / Self Care | Payer: Self-pay | Source: Home / Self Care | Attending: Cardiology

## 2023-06-23 ENCOUNTER — Other Ambulatory Visit: Payer: Self-pay

## 2023-06-23 ENCOUNTER — Ambulatory Visit (HOSPITAL_COMMUNITY)
Admission: RE | Admit: 2023-06-23 | Discharge: 2023-06-23 | Disposition: A | Discharge status: Home / Self Care | Payer: BC Managed Care – PPO | Attending: Cardiology | Admitting: Cardiology

## 2023-06-23 ENCOUNTER — Encounter (HOSPITAL_COMMUNITY): Payer: Self-pay | Admitting: Cardiology

## 2023-06-23 DIAGNOSIS — I4819 Other persistent atrial fibrillation: Secondary | ICD-10-CM | POA: Insufficient documentation

## 2023-06-23 DIAGNOSIS — I484 Atypical atrial flutter: Secondary | ICD-10-CM | POA: Diagnosis not present

## 2023-06-23 DIAGNOSIS — Z7985 Long-term (current) use of injectable non-insulin antidiabetic drugs: Secondary | ICD-10-CM | POA: Diagnosis not present

## 2023-06-23 DIAGNOSIS — Z7901 Long term (current) use of anticoagulants: Secondary | ICD-10-CM | POA: Insufficient documentation

## 2023-06-23 DIAGNOSIS — E119 Type 2 diabetes mellitus without complications: Secondary | ICD-10-CM | POA: Diagnosis not present

## 2023-06-23 DIAGNOSIS — I10 Essential (primary) hypertension: Secondary | ICD-10-CM | POA: Insufficient documentation

## 2023-06-23 DIAGNOSIS — I4891 Unspecified atrial fibrillation: Secondary | ICD-10-CM | POA: Diagnosis not present

## 2023-06-23 HISTORY — PX: CARDIOVERSION: EP1203

## 2023-06-23 LAB — GLUCOSE, CAPILLARY: Glucose-Capillary: 145 mg/dL — ABNORMAL HIGH (ref 70–99)

## 2023-06-23 SURGERY — CARDIOVERSION (CATH LAB)
Anesthesia: General

## 2023-06-23 MED ORDER — LIDOCAINE 2% (20 MG/ML) 5 ML SYRINGE
INTRAMUSCULAR | Status: DC | PRN
  Administered 2023-06-23: 50 mg via INTRAVENOUS

## 2023-06-23 MED ORDER — SODIUM CHLORIDE 0.9% FLUSH: 3.0000 mL | INTRAVENOUS | Status: DC | PRN

## 2023-06-23 MED ORDER — PROPOFOL 10 MG/ML IV BOLUS
INTRAVENOUS | Status: DC | PRN
  Administered 2023-06-23: 50 mg via INTRAVENOUS

## 2023-06-23 MED ORDER — SODIUM CHLORIDE 0.9% FLUSH: 3.0000 mL | Freq: Two times a day (BID) | INTRAVENOUS | Status: DC

## 2023-06-23 SURGICAL SUPPLY — 1 items: PAD DEFIB RADIO PHYSIO CONN (PAD) ×1 IMPLANT

## 2023-06-23 NOTE — Anesthesia Postprocedure Evaluation (Signed)
 Anesthesia Post Note  Patient: Marcus Holland  Procedure(s) Performed: CARDIOVERSION     Patient location during evaluation: PACU Anesthesia Type: General Level of consciousness: awake and alert and oriented Pain management: pain level controlled Vital Signs Assessment: post-procedure vital signs reviewed and stable Respiratory status: spontaneous breathing, nonlabored ventilation and respiratory function stable Cardiovascular status: blood pressure returned to baseline and stable Postop Assessment: no apparent nausea or vomiting Anesthetic complications: no   No notable events documented.  Last Vitals:  Vitals:   06/23/23 0920 06/23/23 0930  BP: (!) 133/95 130/86  Pulse: 69 67  Resp: (!) 21 15  Temp:    SpO2: 97% 97%    Last Pain:  Vitals:   06/23/23 0930  TempSrc:   PainSc: 0-No pain                 Yvonne Petite A.

## 2023-06-23 NOTE — CV Procedure (Signed)
 Procedure:   DCCV  Indication:  Symptomatic atrial fibrillation/atypical flutter  Procedure Note:  The patient signed informed consent.  They have had had therapeutic anticoagulation with rivaroxaban greater than 3 weeks.  Anesthesia was administered by Dr. Malen Gauze.  Patient received 50 mg IV lidocaine and 50 mg IV propofol.Adequate airway was maintained throughout and vital followed per protocol.  They were cardioverted x 2 with 200, 300J of biphasic synchronized energy with anterior pressure.  They converted to NSR.  There were no apparent complications.  The patient had normal neuro status and respiratory status post procedure with vitals stable as recorded elsewhere.    Follow up:  They will continue on current medical therapy and follow up with cardiology as scheduled.  Jodelle Red, MD PhD 06/23/2023 9:11 AM

## 2023-06-23 NOTE — Transfer of Care (Signed)
 Immediate Anesthesia Transfer of Care Note  Patient: Marcus Holland  Procedure(s) Performed: CARDIOVERSION  Patient Location: PACU  Anesthesia Type:General  Level of Consciousness: awake, alert , and oriented  Airway & Oxygen Therapy: Patient Spontanous Breathing and Patient connected to face mask oxygen  Post-op Assessment: Report given to RN and Post -op Vital signs reviewed and stable  Post vital signs: Reviewed and stable  Last Vitals:  Vitals Value Taken Time  BP    Temp    Pulse    Resp    SpO2      Last Pain:  Vitals:   06/23/23 0844  TempSrc: Temporal  PainSc: 0-No pain         Complications: No notable events documented.

## 2023-06-23 NOTE — Interval H&P Note (Signed)
 History and Physical Interval Note:  06/23/2023 8:45 AM  Marcus Holland  has presented today for surgery, with the diagnosis of AFIB.  The various methods of treatment have been discussed with the patient and family. After consideration of risks, benefits and other options for treatment, the patient has consented to  Procedure(s): CARDIOVERSION (N/A) as a surgical intervention.  The patient's history has been reviewed, patient examined, no change in status, stable for surgery.  I have reviewed the patient's chart and labs.  Questions were answered to the patient's satisfaction.     Timber Lucarelli Cristal Deer

## 2023-06-23 NOTE — Anesthesia Preprocedure Evaluation (Addendum)
 Anesthesia Evaluation  Patient identified by MRN, date of birth, ID band Patient awake    Reviewed: Allergy & Precautions, NPO status , Patient's Chart, lab work & pertinent test results, reviewed documented beta blocker date and time   Airway Mallampati: III  TM Distance: >3 FB     Dental no notable dental hx. (+) Teeth Intact, Caps, Dental Advisory Given   Pulmonary neg pulmonary ROS   Pulmonary exam normal breath sounds clear to auscultation       Cardiovascular hypertension, Pt. on medications and Pt. on home beta blockers + dysrhythmias Atrial Fibrillation  Rhythm:Irregular Rate:Normal  EKG 06/16/23 Atrial Fibrillation  Echo 06/27/22 1. Left ventricular ejection fraction, by estimation, is 50 to 55%. Left  ventricular ejection fraction by 2D MOD biplane is 51.1 %. The left  ventricle has low normal function. The left ventricle has no regional wall  motion abnormalities. Left  ventricular diastolic function could not be evaluated.   2. Right ventricular systolic function is normal. The right ventricular  size is normal. Tricuspid regurgitation signal is inadequate for assessing  PA pressure.   3. The mitral valve is grossly normal. Trivial mitral valve  regurgitation.   4. The aortic valve is tricuspid. Aortic valve regurgitation is not  visualized.   5. Aortic dilatation noted. There is borderline dilatation of the  ascending aorta, measuring 39 mm.     Neuro/Psych negative neurological ROS  negative psych ROS   GI/Hepatic ,GERD  ,,  Endo/Other  diabetes, Well Controlled, Type 2  GLP-1 RA therapy- last dose 9 days ago  Renal/GU   negative genitourinary   Musculoskeletal  (+) Arthritis , Osteoarthritis,    Abdominal   Peds  Hematology Xarelto therapy- last dose last pm   Anesthesia Other Findings   Reproductive/Obstetrics                              Anesthesia  Physical Anesthesia Plan  ASA: 3  Anesthesia Plan: General   Post-op Pain Management: Minimal or no pain anticipated   Induction: Intravenous  PONV Risk Score and Plan: 2 and Propofol infusion and Treatment may vary due to age or medical condition  Airway Management Planned: Natural Airway and Mask  Additional Equipment:   Intra-op Plan:   Post-operative Plan:   Informed Consent: I have reviewed the patients History and Physical, chart, labs and discussed the procedure including the risks, benefits and alternatives for the proposed anesthesia with the patient or authorized representative who has indicated his/her understanding and acceptance.     Dental advisory given  Plan Discussed with: CRNA and Anesthesiologist  Anesthesia Plan Comments:          Anesthesia Quick Evaluation

## 2023-06-27 ENCOUNTER — Other Ambulatory Visit (HOSPITAL_COMMUNITY): Payer: Self-pay

## 2023-06-27 DIAGNOSIS — I4819 Other persistent atrial fibrillation: Secondary | ICD-10-CM

## 2023-06-30 ENCOUNTER — Encounter (HOSPITAL_COMMUNITY): Payer: Self-pay | Admitting: Internal Medicine

## 2023-07-01 ENCOUNTER — Telehealth (HOSPITAL_COMMUNITY): Payer: Self-pay

## 2023-07-01 NOTE — Telephone Encounter (Signed)
 Spoke with the patient, detailed instructions given. Asked to call back with any questions. S.Stayce Delancy CCT

## 2023-07-03 ENCOUNTER — Encounter: Payer: Self-pay | Admitting: Cardiovascular Disease

## 2023-07-03 ENCOUNTER — Ambulatory Visit: Payer: BC Managed Care – PPO | Attending: Cardiovascular Disease | Admitting: Cardiovascular Disease

## 2023-07-03 VITALS — BP 140/96 | HR 56 | Ht 73.0 in | Wt 220.4 lb

## 2023-07-03 DIAGNOSIS — I4819 Other persistent atrial fibrillation: Secondary | ICD-10-CM | POA: Diagnosis not present

## 2023-07-03 DIAGNOSIS — Z5181 Encounter for therapeutic drug level monitoring: Secondary | ICD-10-CM | POA: Diagnosis not present

## 2023-07-03 DIAGNOSIS — Z79899 Other long term (current) drug therapy: Secondary | ICD-10-CM | POA: Diagnosis not present

## 2023-07-03 MED ORDER — METOPROLOL SUCCINATE ER 50 MG PO TB24: 50.0000 mg | ORAL_TABLET | Freq: Every day | ORAL | 3 refills | Status: DC

## 2023-07-03 NOTE — Patient Instructions (Signed)
 Medication Instructions:  Your physician recommends that you continue on your current medications as directed. Please refer to the Current Medication list given to you today. *If you need a refill on your cardiac medications before your next appointment, please call your pharmacy*   Lab Work: CBC and BMET - please have pre-procedure lab work completed on Tuesday, April 29 at [facility] near you - no appointment required and this does not need to be fasting If you have labs (blood work) drawn today and your tests are completely normal, you will receive your results only by: MyChart Message (if you have MyChart) OR A paper copy in the mail If you have any lab test that is abnormal or we need to change your treatment, we will call you to review the results.   Testing/Procedures: Cardiac CT - someone from the hospital will contact you to schedule this\ Your physician has requested that you have cardiac CT. Cardiac computed tomography (CT) is a painless test that uses an x-ray machine to take clear, detailed pictures of your heart. For further information please visit [email or web address] Please follow instruction sheet as given.  Atrial Fibrillation Ablation - scheduled for Thursday, May 29 at 5:30 am Your physician has recommended that you have an ablation. Catheter ablation is a medical procedure used to treat some cardiac arrhythmias (irregular heartbeats). During catheter ablation, a long, thin, flexible tube is put into a blood vessel in your groin (upper thigh), or neck. This tube is called an ablation catheter. It is then guided to your heart through the blood vessel. Radio frequency waves destroy small areas of heart tissue where abnormal heartbeats may cause an arrhythmia to start. Please see the instruction sheet given to you today.   Follow-Up: At [facility], you and your health needs are our priority.  As part of our continuing mission to provide you with exceptional heart  care, we have created designated Provider Care Teams.  These Care Teams include your primary Cardiologist (physician) and Advanced Practice Providers (APPs -  Physician Assistants and Nurse Practitioners) who all work together to provide you with the care you need, when you need it.  We recommend signing up for the patient portal called "MyChart".  Sign up information is provided on this After Visit Summary.  MyChart is used to connect with patients for Virtual Visits (Telemedicine).  Patients are able to view lab/test results, encounter notes, upcoming appointments, etc.  Non-urgent messages can be sent to your provider as well.   To learn more about what you can do with MyChart, go to [email or web address].    Your next appointment:   We will schedule follow up after your ablation   Provider:   York Pellant, MD

## 2023-07-03 NOTE — Progress Notes (Signed)
 Electrophysiology Office Note:    Date:  07/03/2023   ID:  Marcus Holland, Marcus Holland [birth year], MRN [id number]  PCP:  Ronnald Nian, MD   [facility] Providers Cardiologist:  Lance Muss, MD     Referring MD: Beatrice Lecher, PA-C   History of Present Illness:    Marcus Holland is a 58 y.o. male with a medical history significant for persistent atrial fibrillatio, hypertension, diabetes, referred for atrial fibrillation management.      I discussed the use of AI scribe software for clinical note transcription with the patient, who gave verbal consent to proceed.  He was initially diagnosed with atrial fibrillation in February 2024 during a routine diabetes follow-up visit, where he had high blood pressure and a high pulse rate. He underwent a cardioversion procedure in February 2024, but the atrial fibrillation recurred by November 2024, leading to the prescription of flecainide to maintain normal rhythm.  He is currently taking metoprolol but has run out of refills as the prescribing doctor has relocated to [place]. During a recent episode of atrial fibrillation, his heart rate was around 65 beats per minute, although it was initially around 100 beats per minute when first detected. He has been following up every four months for diabetes management, with stable blood pressure at 120/whatever until the February 2024 visit when both his pulse rate and blood pressure were high.      Today, he is doing well.  He does not have significant symptoms of atrial fibrillation and cannot tell whether he is in sinus rhythm or not today.  EKGs/Labs/Other Studies Reviewed Today:     Echocardiogram:  TTE 06/27/2022 EF 50 to 55%.  Normal atrial sizes     Stress testing:  Scheduled Results pending   EKG:         Physical Exam:    VS:  BP (!) 140/96 (BP Location: Left Arm, Patient Position: Sitting, Cuff Size: Large)   Pulse (!) 56   Ht 6\' 1"  (1.854 m)   Wt 220 lb 6.4 oz (100  kg)   SpO2 96%   BMI 29.08 kg/m     Wt Readings from Last 3 Encounters:  07/03/23 220 lb 6.4 oz (100 kg)  06/16/23 223 lb 6.4 oz (101.3 kg)  06/03/23 223 lb (101.2 kg)     GEN: Well nourished, well developed in no acute distress CARDIAC: RRR, no murmurs, rubs, gallops RESPIRATORY:  Normal work of breathing MUSCULOSKELETAL: no edema    ASSESSMENT & PLAN:     Persistent atrial fibrillation Maintaining sinus rhythm on flecainide 100 mg Status post cardioversion June 23, 2023 Little if any symptoms of atrial fibrillation --no palpitations We discussed management options.  I do not think flecainide is a good long-term option given that he does not have symptoms.  We discussed the relative risks and benefits of the ablation procedure.  Using a shared decision making approach, we decided to proceed with ablation.  We discussed the indication, rationale, logistics, anticipated benefits, and potential risks of the ablation procedure including but not limited to -- bleed at the groin access site, chest pain, damage to nearby organs such as the diaphragm, lungs, or esophagus, need for a drainage tube, or prolonged hospitalization. I explained that the risk for stroke, heart attack, need for open chest surgery, or even death is very low but not zero. he  expressed understanding and wishes to proceed.   Secondary hypercoagulable state Continue Xarelto 20 mg daily  Diabetes Continue Mounjaro 2.5, Farxiga 10, metformin 1000 mg  Hypertension BP elevated today Will refill metoprolol XL 50 mg daily   Signed, Maurice Small, MD  07/03/2023 8:14 AM    [facility]

## 2023-07-07 ENCOUNTER — Other Ambulatory Visit: Payer: Self-pay | Admitting: Family Medicine

## 2023-07-07 ENCOUNTER — Other Ambulatory Visit: Payer: Self-pay | Admitting: Nurse Practitioner

## 2023-07-07 DIAGNOSIS — E1159 Type 2 diabetes mellitus with other circulatory complications: Secondary | ICD-10-CM

## 2023-07-07 DIAGNOSIS — E119 Type 2 diabetes mellitus without complications: Secondary | ICD-10-CM

## 2023-07-07 DIAGNOSIS — E1169 Type 2 diabetes mellitus with other specified complication: Secondary | ICD-10-CM

## 2023-07-08 ENCOUNTER — Ambulatory Visit (HOSPITAL_COMMUNITY): Attending: Internal Medicine

## 2023-07-08 DIAGNOSIS — I4819 Other persistent atrial fibrillation: Secondary | ICD-10-CM | POA: Insufficient documentation

## 2023-07-08 LAB — MYOCARDIAL PERFUSION IMAGING
Angina Index: 0
Duke Treadmill Score: 6
Estimated workload: 7.5
Exercise duration (min): 6 min
Exercise duration (sec): 20 s
LV dias vol: 93 mL (ref 62–150)
LV sys vol: 31 mL
Nuc Stress EF: 67 %
Peak HR: 136 {beats}/min
Percent HR: 100 %
Rest HR: 59 {beats}/min
Rest Nuclear Isotope Dose: 8.2 mCi
SDS: 0
SRS: 0
SSS: 0
ST Depression (mm): 0 mm
Stress Nuclear Isotope Dose: 28.5 mCi
TID: 0.82

## 2023-07-08 MED ORDER — TECHNETIUM TC 99M TETROFOSMIN IV KIT
8.2000 | PACK | Freq: Once | INTRAVENOUS | Status: AC | PRN
  Administered 2023-07-08: 8.2 via INTRAVENOUS

## 2023-07-08 MED ORDER — TECHNETIUM TC 99M TETROFOSMIN IV KIT
28.5000 | PACK | Freq: Once | INTRAVENOUS | Status: AC | PRN
  Administered 2023-07-08: 28.5 via INTRAVENOUS

## 2023-07-08 NOTE — Telephone Encounter (Signed)
 Last apt 06/03/23

## 2023-08-19 DIAGNOSIS — Z5181 Encounter for therapeutic drug level monitoring: Secondary | ICD-10-CM | POA: Diagnosis not present

## 2023-08-19 DIAGNOSIS — Z79899 Other long term (current) drug therapy: Secondary | ICD-10-CM | POA: Diagnosis not present

## 2023-08-19 LAB — BASIC METABOLIC PANEL WITH GFR
BUN/Creatinine Ratio: 22 — ABNORMAL HIGH (ref 9–20)
BUN: 19 mg/dL (ref 6–24)
CO2: 25 mmol/L (ref 20–29)
Calcium: 9.5 mg/dL (ref 8.7–10.2)
Chloride: 101 mmol/L (ref 96–106)
Creatinine, Ser: 0.88 mg/dL (ref 0.76–1.27)
Glucose: 99 mg/dL (ref 70–99)
Potassium: 3.7 mmol/L (ref 3.5–5.2)
Sodium: 140 mmol/L (ref 134–144)
eGFR: 99 mL/min/{1.73_m2} (ref >59)

## 2023-08-20 LAB — CBC
Hematocrit: 42.6 % (ref 37.5–51.0)
Hemoglobin: 14.2 g/dL (ref 13.0–17.7)
MCH: 29.9 pg (ref 26.6–33.0)
MCHC: 33.3 g/dL (ref 31.5–35.7)
MCV: 90 fL (ref 79–97)
Platelets: 234 10*3/uL (ref 150–450)
RBC: 4.75 x10E6/uL (ref 4.14–5.80)
RDW: 13.4 % (ref 11.6–15.4)
WBC: 5.1 10*3/uL (ref 3.4–10.8)

## 2023-08-21 ENCOUNTER — Encounter (HOSPITAL_COMMUNITY): Payer: Self-pay

## 2023-08-21 ENCOUNTER — Telehealth (HOSPITAL_COMMUNITY): Payer: Self-pay

## 2023-08-21 NOTE — Telephone Encounter (Signed)
 Spoke with patient to complete pre-procedure call.     New medical conditions?  No Recent hospitalizations or surgeries? No Started any new medications? No Patient made aware to contact office to inform of any new medications started. Any changes in activities of daily living? No   Pre-procedure testing scheduled: lab work completed, CT on 08/29/23- instructions sent via MyChart.    Confirmed patient is taking Xarelto  daily and will continue taking medication before procedure or it may need to be rescheduled.  Hold Ozempic for 7 days prior to procedure. Your last dose will be on your scheduled date of 09/09/23. Hold Farxiga  for 3 days prior to procedure. Your last dose will be on 09/14/23.  Confirmed patient is scheduled for Atrial Fibrillation Ablation on Thursday, May 29 with Dr. Marlane Silver. Instructed patient to arrive at the Main Entrance A at [facility]: [address] [place], [place] [address] and check in at Admitting at 5:30 AM.  Advised of plan to go home the same day and will only stay overnight if medically necessary. You MUST have a responsible adult to drive you home and MUST be with you the first 24 hours after you arrive home or your procedure could be cancelled.  Patient verbalized understanding to information provided and is agreeable to proceed with procedure.

## 2023-08-29 ENCOUNTER — Ambulatory Visit (HOSPITAL_COMMUNITY)
Admission: RE | Admit: 2023-08-29 | Discharge: 2023-08-29 | Disposition: A | Discharge status: Home / Self Care | Source: Ambulatory Visit | Attending: Cardiovascular Disease

## 2023-08-29 DIAGNOSIS — Z5181 Encounter for therapeutic drug level monitoring: Secondary | ICD-10-CM | POA: Diagnosis not present

## 2023-08-29 DIAGNOSIS — Z79899 Other long term (current) drug therapy: Secondary | ICD-10-CM

## 2023-08-29 DIAGNOSIS — I4819 Other persistent atrial fibrillation: Secondary | ICD-10-CM | POA: Diagnosis not present

## 2023-08-29 DIAGNOSIS — I517 Cardiomegaly: Secondary | ICD-10-CM | POA: Diagnosis not present

## 2023-08-29 MED ORDER — DILTIAZEM HCL 25 MG/5ML IV SOLN: 10.0000 mg | INTRAVENOUS | Status: DC | PRN

## 2023-08-29 MED ORDER — IOHEXOL 350 MG/ML SOLN
100.0000 mL | Freq: Once | INTRAVENOUS | Status: AC | PRN
  Administered 2023-08-29: 100 mL via INTRAVENOUS

## 2023-08-29 MED ORDER — METOPROLOL TARTRATE 5 MG/5ML IV SOLN: 10.0000 mg | Freq: Once | INTRAVENOUS | Status: DC | PRN

## 2023-09-01 ENCOUNTER — Encounter: Payer: Self-pay | Admitting: Cardiovascular Disease

## 2023-09-02 ENCOUNTER — Telehealth: Payer: Self-pay | Admitting: Cardiovascular Disease

## 2023-09-02 MED ORDER — POTASSIUM CHLORIDE CRYS ER 10 MEQ PO TBCR: 30.0000 meq | EXTENDED_RELEASE_TABLET | Freq: Every day | ORAL | 3 refills | Status: AC

## 2023-09-02 NOTE — Telephone Encounter (Signed)
 Pt's medication was sent to pt's pharmacy as requested. Confirmation received.

## 2023-09-02 NOTE — Telephone Encounter (Signed)
*  STAT* If patient is at the pharmacy, call can be transferred to refill team.   1. Which medications need to be refilled? (please list name of each medication and dose if known) potassium chloride  SA (KLOR-CON  M) 10 MEQ tablet   2. Which pharmacy/location (including street and city if local pharmacy) is medication to be sent to?  [id number] - [place], [place] - [address] AT [address]      3. Do they need a 30 day or 90 day supply? 90 day

## 2023-09-07 NOTE — Pre-Procedure Instructions (Addendum)
 Attempted to call patient regarding procedure on 5/29 with anesthesia.  Anesthesia requires Mounjaro  to be held for 7 days prior to procedure.  Left voicemail on last dose can be 5/21, don't take after then until after procedure.

## 2023-09-11 ENCOUNTER — Telehealth (HOSPITAL_COMMUNITY): Payer: Self-pay

## 2023-09-11 NOTE — Telephone Encounter (Signed)
 Spoke with patient to discuss upcoming procedure.   CT: completed.  Labs: completed.   Any recent signs of acute illness or been started on antibiotics? No Any new medications started? No Any medications to hold?  Hold Mounjaro  for 7 days prior to procedure. Your last dose will be on your scheduled date of 09/09/23. Hold Farxiga  for 3 days prior to procedure. Your last dose will be on 09/14/23.  Any missed doses of blood thinner? No Advised patient to continue taking ANTICOAGULANT: Xarelto  (Rivaroxaban ) daily without missing any doses.  Medication instructions:  On the morning of your procedure DO NOT take any medication., including Xarelto  or the procedure may be rescheduled. Nothing to eat or drink after midnight prior to your procedure.  Confirmed patient is scheduled for Atrial Fibrillation Ablation on Thursday, May 29 with Dr. Marlane Silver. Instructed patient to arrive at the Main Entrance A at [facility]: [address] [place], [place] [address] and check in at Admitting at 5:30 AM.  Advised of plan to go home the same day and will only stay overnight if medically necessary. You MUST have a responsible adult to drive you home and MUST be with you the first 24 hours after you arrive home or your procedure could be cancelled.  Patient verbalized understanding to all instructions provided and agreed to proceed with procedure.

## 2023-09-16 ENCOUNTER — Other Ambulatory Visit: Payer: Self-pay | Admitting: Family Medicine

## 2023-09-16 DIAGNOSIS — E119 Type 2 diabetes mellitus without complications: Secondary | ICD-10-CM

## 2023-09-17 NOTE — Pre-Procedure Instructions (Signed)
 Attempted to call patient regarding procedure instructions.  Left voicemail on the following items: Arrival time 0515 Nothing to eat or drink after midnight No meds AM of procedure Responsible person to drive you home and stay with you for 24 hrs  Have you missed any doses of anti-coagulant Xarleto- should be taken once a day, if you have missed any doses please let us  know.  Don't take dose morning of procedure.

## 2023-09-18 ENCOUNTER — Encounter: Payer: Self-pay | Admitting: Emergency Medicine

## 2023-09-18 ENCOUNTER — Other Ambulatory Visit: Payer: Self-pay

## 2023-09-18 ENCOUNTER — Ambulatory Visit (HOSPITAL_COMMUNITY)
Admission: RE | Disposition: A | Discharge status: Home / Self Care | Payer: Self-pay | Source: Home / Self Care | Attending: Cardiovascular Disease

## 2023-09-18 ENCOUNTER — Ambulatory Visit (HOSPITAL_COMMUNITY)
Admission: RE | Admit: 2023-09-18 | Discharge: 2023-09-18 | Disposition: A | Discharge status: Home / Self Care | Attending: Cardiovascular Disease | Admitting: Cardiovascular Disease

## 2023-09-18 ENCOUNTER — Ambulatory Visit (HOSPITAL_COMMUNITY): Admitting: Anesthesiology

## 2023-09-18 ENCOUNTER — Telehealth: Payer: Self-pay | Admitting: Cardiovascular Disease

## 2023-09-18 ENCOUNTER — Encounter (HOSPITAL_COMMUNITY): Payer: Self-pay | Admitting: Cardiovascular Disease

## 2023-09-18 DIAGNOSIS — E119 Type 2 diabetes mellitus without complications: Secondary | ICD-10-CM | POA: Diagnosis not present

## 2023-09-18 DIAGNOSIS — Z7984 Long term (current) use of oral hypoglycemic drugs: Secondary | ICD-10-CM | POA: Insufficient documentation

## 2023-09-18 DIAGNOSIS — I119 Hypertensive heart disease without heart failure: Secondary | ICD-10-CM | POA: Diagnosis not present

## 2023-09-18 DIAGNOSIS — I10 Essential (primary) hypertension: Secondary | ICD-10-CM | POA: Diagnosis not present

## 2023-09-18 DIAGNOSIS — D6869 Other thrombophilia: Secondary | ICD-10-CM | POA: Insufficient documentation

## 2023-09-18 DIAGNOSIS — Z79899 Other long term (current) drug therapy: Secondary | ICD-10-CM | POA: Insufficient documentation

## 2023-09-18 DIAGNOSIS — Z7901 Long term (current) use of anticoagulants: Secondary | ICD-10-CM | POA: Insufficient documentation

## 2023-09-18 DIAGNOSIS — I4891 Unspecified atrial fibrillation: Secondary | ICD-10-CM | POA: Diagnosis not present

## 2023-09-18 DIAGNOSIS — I4819 Other persistent atrial fibrillation: Secondary | ICD-10-CM | POA: Diagnosis not present

## 2023-09-18 HISTORY — PX: ATRIAL FIBRILLATION ABLATION: EP1191

## 2023-09-18 LAB — GLUCOSE, CAPILLARY
Glucose-Capillary: 121 mg/dL — ABNORMAL HIGH (ref 70–99)
Glucose-Capillary: 144 mg/dL — ABNORMAL HIGH (ref 70–99)

## 2023-09-18 LAB — POCT ACTIVATED CLOTTING TIME: Activated Clotting Time: 360 s

## 2023-09-18 SURGERY — ATRIAL FIBRILLATION ABLATION
Anesthesia: General

## 2023-09-18 MED ORDER — FENTANYL CITRATE (PF) 100 MCG/2ML IJ SOLN
INTRAMUSCULAR | Status: AC
  Filled 2023-09-18: qty 2

## 2023-09-18 MED ORDER — SODIUM CHLORIDE 0.9 % IV SOLN
250.0000 mL | INTRAVENOUS | Status: DC | PRN
Start: 2023-09-18 — End: 2023-09-18

## 2023-09-18 MED ORDER — ROCURONIUM BROMIDE 10 MG/ML (PF) SYRINGE
PREFILLED_SYRINGE | INTRAVENOUS | Status: DC | PRN
  Administered 2023-09-18: 60 mg via INTRAVENOUS

## 2023-09-18 MED ORDER — SODIUM CHLORIDE 0.9% FLUSH: 3.0000 mL | Freq: Two times a day (BID) | INTRAVENOUS | Status: DC

## 2023-09-18 MED ORDER — HEPARIN SODIUM (PORCINE) 1000 UNIT/ML IJ SOLN
INTRAMUSCULAR | Status: AC
  Filled 2023-09-18: qty 10

## 2023-09-18 MED ORDER — PROTAMINE SULFATE 10 MG/ML IV SOLN
INTRAVENOUS | Status: DC | PRN
  Administered 2023-09-18: 10 mg via INTRAVENOUS
  Administered 2023-09-18: 20 mg via INTRAVENOUS
  Administered 2023-09-18 (×2): 10 mg via INTRAVENOUS

## 2023-09-18 MED ORDER — PROPOFOL 10 MG/ML IV BOLUS
INTRAVENOUS | Status: DC | PRN
Start: 2023-09-18 — End: 2023-09-18
  Administered 2023-09-18: 150 mg via INTRAVENOUS

## 2023-09-18 MED ORDER — PHENYLEPHRINE HCL-NACL 20-0.9 MG/250ML-% IV SOLN
INTRAVENOUS | Status: DC | PRN
  Administered 2023-09-18: 30 ug/min via INTRAVENOUS

## 2023-09-18 MED ORDER — MIDAZOLAM HCL 2 MG/2ML IJ SOLN
INTRAMUSCULAR | Status: DC | PRN
  Administered 2023-09-18: 2 mg via INTRAVENOUS

## 2023-09-18 MED ORDER — ACETAMINOPHEN 325 MG PO TABS: 650.0000 mg | ORAL_TABLET | ORAL | Status: DC | PRN

## 2023-09-18 MED ORDER — DEXAMETHASONE SODIUM PHOSPHATE 10 MG/ML IJ SOLN
INTRAMUSCULAR | Status: DC | PRN
  Administered 2023-09-18: 4 mg via INTRAVENOUS

## 2023-09-18 MED ORDER — SODIUM CHLORIDE 0.9 % IV SOLN: INTRAVENOUS | Status: DC

## 2023-09-18 MED ORDER — MIDAZOLAM HCL 2 MG/2ML IJ SOLN
INTRAMUSCULAR | Status: AC
  Filled 2023-09-18: qty 2

## 2023-09-18 MED ORDER — ONDANSETRON HCL 4 MG/2ML IJ SOLN: 4.0000 mg | Freq: Four times a day (QID) | INTRAMUSCULAR | Status: DC | PRN

## 2023-09-18 MED ORDER — HEPARIN (PORCINE) IN NACL 1000-0.9 UT/500ML-% IV SOLN
INTRAVENOUS | Status: DC | PRN
  Administered 2023-09-18 (×3): 500 mL

## 2023-09-18 MED ORDER — SUGAMMADEX SODIUM 200 MG/2ML IV SOLN
INTRAVENOUS | Status: DC | PRN
  Administered 2023-09-18: 186 mg via INTRAVENOUS

## 2023-09-18 MED ORDER — SODIUM CHLORIDE 0.9% FLUSH
3.0000 mL | INTRAVENOUS | Status: DC | PRN
Start: 2023-09-18 — End: 2023-09-18

## 2023-09-18 MED ORDER — ATROPINE SULFATE 1 MG/10ML IJ SOSY
PREFILLED_SYRINGE | INTRAMUSCULAR | Status: DC | PRN
  Administered 2023-09-18: 1 mg via INTRAVENOUS

## 2023-09-18 MED ORDER — ONDANSETRON HCL 4 MG/2ML IJ SOLN
INTRAMUSCULAR | Status: DC | PRN
  Administered 2023-09-18: 4 mg via INTRAVENOUS

## 2023-09-18 MED ORDER — HEPARIN SODIUM (PORCINE) 1000 UNIT/ML IJ SOLN
INTRAMUSCULAR | Status: DC | PRN
Start: 2023-09-18 — End: 2023-09-18
  Administered 2023-09-18: 16000 [IU] via INTRAVENOUS

## 2023-09-18 MED ORDER — FENTANYL CITRATE (PF) 250 MCG/5ML IJ SOLN
INTRAMUSCULAR | Status: DC | PRN
  Administered 2023-09-18: 100 ug via INTRAVENOUS

## 2023-09-18 MED ORDER — LIDOCAINE 2% (20 MG/ML) 5 ML SYRINGE
INTRAMUSCULAR | Status: DC | PRN
  Administered 2023-09-18: 100 mg via INTRAVENOUS

## 2023-09-18 SURGICAL SUPPLY — 20 items
BAG SNAP BAND KOVER 36X36 (MISCELLANEOUS) IMPLANT
CABLE PFA RX CATH CONN (CABLE) IMPLANT
CATH FARAWAVE ABLATION 31 (CATHETERS) IMPLANT
CATH GE 8FR SOUNDSTAR (CATHETERS) IMPLANT
CATH OCTARAY 2.0 F 3-3-3-3-3 (CATHETERS) IMPLANT
CATH WEBSTER BI DIR CS D-F CRV (CATHETERS) IMPLANT
CLOSURE PERCLOSE PROSTYLE (VASCULAR PRODUCTS) IMPLANT
COVER SWIFTLINK CONNECTOR (BAG) ×1 IMPLANT
DEVICE CLOSURE MYNXGRIP 6/7F (Vascular Products) IMPLANT
DILATOR VESSEL 38 20CM 16FR (INTRODUCER) IMPLANT
GUIDEWIRE INQWIRE 1.5J.035X260 (WIRE) IMPLANT
KIT VERSACROSS CNCT FARADRIVE (KITS) IMPLANT
PACK EP LF (CUSTOM PROCEDURE TRAY) ×1 IMPLANT
PAD DEFIB RADIO PHYSIO CONN (PAD) ×1 IMPLANT
PATCH CARTO3 (PAD) IMPLANT
SHEATH AVANTI 11CM 9FR (SHEATH) IMPLANT
SHEATH FARADRIVE STEERABLE (SHEATH) IMPLANT
SHEATH PINNACLE 8F 10CM (SHEATH) IMPLANT
SHEATH PINNACLE 9F 10CM (SHEATH) IMPLANT
SHEATH PROBE COVER 6X72 (BAG) IMPLANT

## 2023-09-18 NOTE — Anesthesia Procedure Notes (Signed)
 Procedure Name: Intubation Date/Time: 09/18/2023 7:40 AM  Performed by: Claybon Cuna, CRNAPre-anesthesia Checklist: Patient identified, Emergency Drugs available, Suction available and Patient being monitored Patient Re-evaluated:Patient Re-evaluated prior to induction Oxygen Delivery Method: Circle System Utilized Preoxygenation: Pre-oxygenation with 100% oxygen Induction Type: IV induction Ventilation: Mask ventilation without difficulty Laryngoscope Size: Mac and 3 Grade View: Grade II Tube type: Oral Tube size: 7.5 mm Number of attempts: 1 Airway Equipment and Method: Stylet and Oral airway Placement Confirmation: ETT inserted through vocal cords under direct vision, positive ETCO2 and breath sounds checked- equal and bilateral Tube secured with: Tape Dental Injury: Teeth and Oropharynx as per pre-operative assessment

## 2023-09-18 NOTE — Telephone Encounter (Signed)
 Left message to call back

## 2023-09-18 NOTE — Progress Notes (Signed)
 Up and walked and tolerated well; bilat groins stable, no bleeding or hematoma

## 2023-09-18 NOTE — Anesthesia Postprocedure Evaluation (Signed)
 Anesthesia Post Note  Patient: QUILL GRINDER  Procedure(s) Performed: ATRIAL FIBRILLATION ABLATION     Patient location during evaluation: PACU Anesthesia Type: General Level of consciousness: awake and alert Pain management: pain level controlled Vital Signs Assessment: post-procedure vital signs reviewed and stable Respiratory status: spontaneous breathing, nonlabored ventilation, respiratory function stable and patient connected to nasal cannula oxygen Cardiovascular status: blood pressure returned to baseline and stable Postop Assessment: no apparent nausea or vomiting Anesthetic complications: no   There were no known notable events for this encounter.  Last Vitals:  Vitals:   09/18/23 1046 09/18/23 1100  BP:  126/72  Pulse: 64 62  Resp: 13 13  Temp:    SpO2: 90% 90%    Last Pain:  Vitals:   09/18/23 0909  TempSrc: Oral  PainSc: 0-No pain   Pain Goal:                   Lethaniel Rave

## 2023-09-18 NOTE — Discharge Instructions (Signed)

## 2023-09-18 NOTE — H&P (Signed)
 Electrophysiology Office Note:    Date:  09/18/2023   ID:  Marcus Holland, Marcus Holland 12/12/1965, MRN [id number]  PCP:  Watson Hacking, MD   [facility] Providers Cardiologist:  Avery Bodo, MD Electrophysiologist:  Efraim Grange, MD     Referring MD: No ref. provider found   History of Present Illness:    Marcus Holland is a 58 y.o. male with a medical history significant for persistent atrial fibrillatio, hypertension, diabetes, referred for atrial fibrillation management.      I discussed the use of AI scribe software for clinical note transcription with the patient, who gave verbal consent to proceed.  He was initially diagnosed with atrial fibrillation in February 2024 during a routine diabetes follow-up visit, where he had high blood pressure and a high pulse rate. He underwent a cardioversion procedure in February 2024, but the atrial fibrillation recurred by November 2024, leading to the prescription of flecainide  to maintain normal rhythm.  He is currently taking metoprolol  but has run out of refills as the prescribing doctor has relocated to [place] . During a recent episode of atrial fibrillation, his heart rate was around 65 beats per minute, although it was initially around 100 beats per minute when first detected. He has been following up every four months for diabetes management, with stable blood pressure at 120/whatever until the February 2024 visit when both his pulse rate and blood pressure were high.      Today, he is doing well.  He does not have significant symptoms of atrial fibrillation and cannot tell whether he is in sinus rhythm or not today.  I reviewed the patient's CT and labs. There was no LAA thrombus. he  has not missed any doses of anticoagulation, and he took his dose last night. There have been no changes in the patient's diagnoses, medications, or condition since our recent clinic visit.   EKGs/Labs/Other Studies Reviewed Today:      Echocardiogram:  TTE 06/27/2022 EF 50 to 55%.  Normal atrial sizes     Stress testing:  Scheduled Results pending   EKG:         Physical Exam:    VS:  BP (!) 142/82   Pulse 60   Temp 98.1 F (36.7 C) (Oral)   Resp 18   Ht 6\' 1"  (1.854 m)   Wt 93 kg   SpO2 97%   BMI 27.05 kg/m     Wt Readings from Last 3 Encounters:  09/18/23 93 kg  07/08/23 101.2 kg  07/03/23 100 kg     GEN: Well nourished, well developed in no acute distress CARDIAC: RRR, no murmurs, rubs, gallops RESPIRATORY:  Normal work of breathing MUSCULOSKELETAL: no edema    ASSESSMENT & PLAN:     Persistent atrial fibrillation Maintaining sinus rhythm on flecainide  100 mg Status post cardioversion June 23, 2023 Little if any symptoms of atrial fibrillation --no palpitations We discussed management options.  I do not think flecainide  is a good long-term option given that he does not have symptoms.  We discussed the relative risks and benefits of the ablation procedure.  Using a shared decision making approach, we decided to proceed with ablation.  We discussed the indication, rationale, logistics, anticipated benefits, and potential risks of the ablation procedure including but not limited to -- bleed at the groin access site, chest pain, damage to nearby organs such as the diaphragm, lungs, or esophagus, need for a drainage tube, or prolonged hospitalization. I explained  that the risk for stroke, heart attack, need for open chest surgery, or even death is very low but not zero. he  expressed understanding and wishes to proceed.   Secondary hypercoagulable state Continue Xarelto  20 mg daily  Diabetes Continue Mounjaro  2.5, Farxiga  10, metformin  1000 mg  Hypertension BP elevated today Will refill metoprolol  XL 50 mg daily   Signed, Efraim Grange, MD  09/18/2023 7:03 AM    [facility]

## 2023-09-18 NOTE — Transfer of Care (Signed)
 Immediate Anesthesia Transfer of Care Note  Patient: Marcus Holland  Procedure(s) Performed: ATRIAL FIBRILLATION ABLATION  Patient Location: PACU  Anesthesia Type:General  Level of Consciousness: awake  Airway & Oxygen Therapy: Patient connected to face mask oxygen  Post-op Assessment: Post -op Vital signs reviewed and stable  Post vital signs: stable  Last Vitals:  Vitals Value Taken Time  BP 142/76 09/18/23 0905  Temp    Pulse 75 09/18/23 0909  Resp 18 09/18/23 0909  SpO2 95 % 09/18/23 0909  Vitals shown include unfiled device data.  Last Pain:  Vitals:   09/18/23 0557  TempSrc: Oral  PainSc: 0-No pain         Complications: There were no known notable events for this encounter.

## 2023-09-18 NOTE — Anesthesia Preprocedure Evaluation (Addendum)
 Anesthesia Evaluation  Patient identified by MRN, date of birth, ID band Patient awake    Reviewed: Allergy & Precautions, H&P , NPO status , Patient's Chart, lab work & pertinent test results  Airway Mallampati: II  TM Distance: >3 FB Neck ROM: Full    Dental no notable dental hx.    Pulmonary neg pulmonary ROS   Pulmonary exam normal breath sounds clear to auscultation       Cardiovascular hypertension, (-) angina (-) Past MI Normal cardiovascular exam+ dysrhythmias Atrial Fibrillation  Rhythm:Regular Rate:Normal     Neuro/Psych neg Seizures negative neurological ROS  negative psych ROS   GI/Hepatic negative GI ROS, Neg liver ROS,,,  Endo/Other  diabetes, Type 2    Renal/GU negative Renal ROS  negative genitourinary   Musculoskeletal  (+) Arthritis ,    Abdominal   Peds negative pediatric ROS (+)  Hematology negative hematology ROS (+)   Anesthesia Other Findings   Reproductive/Obstetrics negative OB ROS                              Anesthesia Physical Anesthesia Plan  ASA: 3  Anesthesia Plan: General   Post-op Pain Management: Minimal or no pain anticipated   Induction: Intravenous  PONV Risk Score and Plan: 2 and Ondansetron, Dexamethasone and Treatment may vary due to age or medical condition  Airway Management Planned: Oral ETT  Additional Equipment: None  Intra-op Plan:   Post-operative Plan: Extubation in OR  Informed Consent: I have reviewed the patients History and Physical, chart, labs and discussed the procedure including the risks, benefits and alternatives for the proposed anesthesia with the patient or authorized representative who has indicated his/her understanding and acceptance.     Dental advisory given  Plan Discussed with: CRNA  Anesthesia Plan Comments:         Anesthesia Quick Evaluation

## 2023-09-18 NOTE — Telephone Encounter (Signed)
 Pt had an Ablation this morning and is requesting a call back to discuss needing a work note w/ restrictions, etc. Ok to put in [place] so he can print off

## 2023-09-19 ENCOUNTER — Telehealth (HOSPITAL_COMMUNITY): Payer: Self-pay

## 2023-09-19 MED FILL — Fentanyl Citrate Preservative Free (PF) Inj 100 MCG/2ML: INTRAMUSCULAR | Qty: 2 | Status: AC

## 2023-09-19 NOTE — Telephone Encounter (Signed)
 Spoke with patient to complete post procedure follow up call.  Patient reports no complications with groin sites.   Instructions reviewed with patient:  Remove large bandage at puncture site after 24 hours. It is normal to have bruising, tenderness, mild swelling, and a pea or marble sized lump/knot at the groin site which can take up to three months to resolve.  Get help right away if you notice sudden swelling at the puncture site.  Check your puncture site every day for signs of infection: fever, redness, swelling, pus drainage, warmth, foul odor or excessive pain. If this occurs, please call the office at [phone], to speak with the nurse. Get help right away if your puncture site is bleeding and the bleeding does not stop after applying firm pressure to the area.  You may continue to have skipped beats/ atrial fibrillation during the first several months after your procedure.  It is very important not to miss any doses of your blood thinner Xarelto . Patient restarted taking this medication on 09/18/23.   You will follow up with the Afib clinic on 10/16/23 and follow up with the APP 12/19/23. Patient stated he will be out of town from 8/9-12/28/23 and requested an appointment the following week. Appointment moved to 12/29/23.  Patient verbalized understanding to all instructions provided.

## 2023-09-24 ENCOUNTER — Telehealth: Payer: Self-pay | Admitting: Cardiovascular Disease

## 2023-09-24 NOTE — Telephone Encounter (Signed)
 Patient is calling because he recently had an ablation. Patient noticed last night he has a spot near the groin area that is swollen. Patient stated the swollen area is about 2-3in long. Patient stated he does feel discomfort and pain when walking. Please advise.

## 2023-09-24 NOTE — Telephone Encounter (Signed)
 Spoke with patient, states his right side groin site about the size of a pea like discharge paperwork described. Left side is about 2-3 inches long and only about 1 inch wide. Patient denies any drainage, redness, or warmth at site. Educated patient that the left side closure device is larger and expected to feel more long than wide. Patient states discomfort has improved with each day. Ensured patient that this is expected and does not sound concerning. No further needs at this time, patient thankful for the follow up call.

## 2023-09-29 ENCOUNTER — Other Ambulatory Visit: Payer: BC Managed Care – PPO

## 2023-09-29 DIAGNOSIS — E785 Hyperlipidemia, unspecified: Secondary | ICD-10-CM | POA: Diagnosis not present

## 2023-09-29 DIAGNOSIS — E1169 Type 2 diabetes mellitus with other specified complication: Secondary | ICD-10-CM | POA: Diagnosis not present

## 2023-09-29 DIAGNOSIS — E1159 Type 2 diabetes mellitus with other circulatory complications: Secondary | ICD-10-CM | POA: Diagnosis not present

## 2023-09-29 DIAGNOSIS — E1122 Type 2 diabetes mellitus with diabetic chronic kidney disease: Secondary | ICD-10-CM | POA: Diagnosis not present

## 2023-09-29 DIAGNOSIS — N1831 Chronic kidney disease, stage 3a: Secondary | ICD-10-CM

## 2023-09-29 DIAGNOSIS — I152 Hypertension secondary to endocrine disorders: Secondary | ICD-10-CM

## 2023-09-29 LAB — LIPID PANEL

## 2023-09-30 ENCOUNTER — Ambulatory Visit: Payer: BC Managed Care – PPO | Admitting: Family Medicine

## 2023-09-30 VITALS — BP 120/80 | HR 68 | Ht 73.0 in | Wt 212.4 lb

## 2023-09-30 DIAGNOSIS — M7542 Impingement syndrome of left shoulder: Secondary | ICD-10-CM

## 2023-09-30 DIAGNOSIS — I4819 Other persistent atrial fibrillation: Secondary | ICD-10-CM | POA: Diagnosis not present

## 2023-09-30 DIAGNOSIS — E118 Type 2 diabetes mellitus with unspecified complications: Secondary | ICD-10-CM

## 2023-09-30 DIAGNOSIS — E1169 Type 2 diabetes mellitus with other specified complication: Secondary | ICD-10-CM

## 2023-09-30 DIAGNOSIS — E669 Obesity, unspecified: Secondary | ICD-10-CM

## 2023-09-30 DIAGNOSIS — K219 Gastro-esophageal reflux disease without esophagitis: Secondary | ICD-10-CM

## 2023-09-30 DIAGNOSIS — Z0000 Encounter for general adult medical examination without abnormal findings: Secondary | ICD-10-CM | POA: Diagnosis not present

## 2023-09-30 DIAGNOSIS — E1159 Type 2 diabetes mellitus with other circulatory complications: Secondary | ICD-10-CM

## 2023-09-30 DIAGNOSIS — D6869 Other thrombophilia: Secondary | ICD-10-CM

## 2023-09-30 DIAGNOSIS — E785 Hyperlipidemia, unspecified: Secondary | ICD-10-CM

## 2023-09-30 DIAGNOSIS — I152 Hypertension secondary to endocrine disorders: Secondary | ICD-10-CM

## 2023-09-30 DIAGNOSIS — E119 Type 2 diabetes mellitus without complications: Secondary | ICD-10-CM

## 2023-09-30 LAB — LIPID PANEL
Cholesterol, Total: 122 mg/dL (ref 100–199)
HDL: 34 mg/dL — ABNORMAL LOW (ref >39)
LDL CALC COMMENT:: 3.6 ratio (ref 0.0–5.0)
LDL Chol Calc (NIH): 61 mg/dL (ref 0–99)
Triglycerides: 158 mg/dL — ABNORMAL HIGH (ref 0–149)
VLDL Cholesterol Cal: 27 mg/dL (ref 5–40)

## 2023-09-30 LAB — CBC WITH DIFFERENTIAL/PLATELET
Basophils Absolute: 0 10*3/uL (ref 0.0–0.2)
Basos: 1 %
EOS (ABSOLUTE): 0.1 10*3/uL (ref 0.0–0.4)
Eos: 2 %
Hematocrit: 40.7 % (ref 37.5–51.0)
Hemoglobin: 13.1 g/dL (ref 13.0–17.7)
Immature Grans (Abs): 0 10*3/uL (ref 0.0–0.1)
Immature Granulocytes: 0 %
Lymphocytes Absolute: 1.4 10*3/uL (ref 0.7–3.1)
Lymphs: 25 %
MCH: 29.8 pg (ref 26.6–33.0)
MCHC: 32.2 g/dL (ref 31.5–35.7)
MCV: 93 fL (ref 79–97)
Monocytes Absolute: 0.4 10*3/uL (ref 0.1–0.9)
Monocytes: 7 %
Neutrophils Absolute: 3.7 10*3/uL (ref 1.4–7.0)
Neutrophils: 65 %
Platelets: 223 10*3/uL (ref 150–450)
RBC: 4.39 x10E6/uL (ref 4.14–5.80)
RDW: 13.2 % (ref 11.6–15.4)
WBC: 5.6 10*3/uL (ref 3.4–10.8)

## 2023-09-30 LAB — COMPREHENSIVE METABOLIC PANEL WITH GFR
ALT: 24 IU/L (ref 0–44)
AST: 17 IU/L (ref 0–40)
Albumin: 4.3 g/dL (ref 3.8–4.9)
Alkaline Phosphatase: 139 IU/L — ABNORMAL HIGH (ref 44–121)
BUN/Creatinine Ratio: 16 (ref 10–24)
BUN: 16 mg/dL (ref 8–27)
Bilirubin Total: 0.5 mg/dL (ref 0.0–1.2)
CO2: 22 mmol/L (ref 20–29)
Calcium: 9.1 mg/dL (ref 8.6–10.2)
Chloride: 102 mmol/L (ref 96–106)
Creatinine, Ser: 1 mg/dL (ref 0.76–1.27)
Globulin, Total: 1.9 g/dL (ref 1.5–4.5)
Glucose: 111 mg/dL — ABNORMAL HIGH (ref 70–99)
Potassium: 3.5 mmol/L (ref 3.5–5.2)
Sodium: 140 mmol/L (ref 134–144)
Total Protein: 6.2 g/dL (ref 6.0–8.5)
eGFR: 86 mL/min/{1.73_m2} (ref >59)

## 2023-09-30 LAB — POCT UA - MICROALBUMIN
Albumin/Creatinine Ratio, Urine, POC: 5
Creatinine, POC: 68.1 mg/dL
Microalbumin Ur, POC: 7.3 mg/L

## 2023-09-30 LAB — POCT GLYCOSYLATED HEMOGLOBIN (HGB A1C): Hemoglobin A1C: 5.9 % — AB (ref 4.0–5.6)

## 2023-09-30 MED ORDER — LISINOPRIL-HYDROCHLOROTHIAZIDE 20-12.5 MG PO TABS: 1.0000 | ORAL_TABLET | Freq: Every day | ORAL | 3 refills | Status: AC

## 2023-09-30 MED ORDER — ATORVASTATIN CALCIUM 20 MG PO TABS: 20.0000 mg | ORAL_TABLET | Freq: Every day | ORAL | 3 refills | Status: AC

## 2023-09-30 MED ORDER — METFORMIN HCL 1000 MG PO TABS: 1000.0000 mg | ORAL_TABLET | Freq: Every day | ORAL | 1 refills | Status: AC

## 2023-09-30 MED ORDER — AMLODIPINE BESYLATE 10 MG PO TABS: 10.0000 mg | ORAL_TABLET | Freq: Every day | ORAL | 3 refills | Status: AC

## 2023-09-30 NOTE — Progress Notes (Signed)
 Complete physical exam  Patient: Marcus Holland   DOB: 1965/10/15   58 y.o. Male  MRN: [id number]  Subjective:     Chief Complaint  Patient presents with   Annual Exam    Cpe.  Needs metformin  directions to be updated.     AGAMJOT KILGALLON is a 58 y.o. male who presents today for a complete physical exam.  He reports consuming a general diet. Participates in martial arts as his activity. He generally feels well. He reports sleeping fairly well.  He has decreased his metformin  to 1 pill/day and has lost weight with his martial arts training.  He did have an ablation on May 29 but continues on Xarelto .  He is scheduled for follow-up concerning this.  His reflux is not causing much symptoms.  He continues on his other medications.  He states that his left shoulder has improved a lot since he started martial arts classes.  He continues on Lipitor and having no difficulty with that.  He continue is on Mounjaro , Farxiga  and as mentioned above lower dosing of metformin .  Presently he is also taking Testim to help keep this under control.  Most recent fall risk assessment:    09/30/2023    1:52 PM  Fall Risk   Falls in the past year? 0  Number falls in past yr: 0  Injury with Fall? 0  Risk for fall due to : No Fall Risks  Follow up Falls evaluation completed     Most recent depression screenings:    09/30/2023    1:52 PM 05/29/2022   10:29 AM  PHQ 2/9 Scores  PHQ - 2 Score 0 0    Vision:Not within last year  and Dental: No current dental problems and No regular dental care     Immunization History  Administered Date(s) Administered   Pneumococcal Conjugate-13 02/18/2017   Pneumococcal Polysaccharide-23 07/13/2019   Tdap 01/23/2007, 02/18/2017    Health Maintenance  Topic Date Due   HIV Screening  Never done   Zoster Vaccines- Shingrix (1 of 2) Never done   Fecal DNA (Cologuard)  12/08/2022   OPHTHALMOLOGY EXAM  05/24/2023   INFLUENZA VACCINE  11/21/2023   HEMOGLOBIN A1C  03/31/2024    Pneumococcal Vaccine 35-58 Years old (3 of 3 - PPSV23, PCV20 or PCV21) 07/12/2024   Diabetic kidney evaluation - eGFR measurement  09/28/2024   Diabetic kidney evaluation - Urine ACR  09/29/2024   FOOT EXAM  09/29/2024   DTaP/Tdap/Td (3 - Td or Tdap) 02/19/2027   Hepatitis C Screening  Completed   HPV VACCINES  Aged Out   Meningococcal B Vaccine  Aged Out   Colonoscopy  Discontinued   COVID-19 Vaccine  Discontinued    Patient Care Team: Watson Hacking, MD as PCP - General (Family Medicine) Lucendia Rusk, MD as PCP - Cardiology (Cardiology) Mealor, Donnamae Gaba, MD as PCP - Electrophysiology (Cardiology)   Outpatient Medications Prior to Visit  Medication Sig   Ascorbic Acid (VITAMIN C PO) Take 1 tablet by mouth daily.   cholecalciferol (VITAMIN D3) 25 MCG (1000 UNIT) tablet Take 1,000 Units by mouth daily.   FARXIGA  10 MG TABS tablet TAKE 1 TABLET(10 MG) BY MOUTH DAILY BEFORE BREAKFAST   KRILL OIL PO Take 1 capsule by mouth daily.   metoprolol  succinate (TOPROL -XL) 50 MG 24 hr tablet Take 1 tablet (50 mg total) by mouth daily. Take with or immediately following a meal.   MOUNJARO  2.5 MG/0.5ML Pen  INJECT 12.5 MG UNDER THE SKIN ONE DAY A WEEK   Multiple Vitamins-Minerals (MULTIVITAMIN WITH MINERALS) tablet Take 1 tablet by mouth daily.   Multiple Vitamins-Minerals (PRESERVISION AREDS 2+MULTI VIT PO) Take 1 capsule by mouth in the morning and at bedtime.   OVER THE COUNTER MEDICATION Apply 1 Application topically 3 (three) times daily. Clear Nail Organic Anti Fungal Treatment   potassium chloride  (KLOR-CON  M) 10 MEQ tablet Take 3 tablets (30 mEq total) by mouth daily.   terazosin  (HYTRIN ) 2 MG capsule TAKE 1 CAPSULE EVERY NIGHT AT BEDTIME   XARELTO  20 MG TABS tablet TAKE 1 TABLET(20 MG) BY MOUTH DAILY WITH SUPPER   zinc gluconate 50 MG tablet Take 50 mg by mouth daily.   [DISCONTINUED] amLODipine  (NORVASC ) 10 MG tablet TAKE 1 TABLET DAILY   [DISCONTINUED] atorvastatin  (LIPITOR)  20 MG tablet TAKE 1 TABLET DAILY   [DISCONTINUED] lisinopril -hydrochlorothiazide  (ZESTORETIC ) 20-12.5 MG tablet TAKE 1 TABLET DAILY   [DISCONTINUED] metFORMIN  (GLUCOPHAGE ) 1000 MG tablet TAKE 1 TABLET TWICE A DAY WITH MEALS (Patient taking differently: Take 1,000 mg by mouth daily with breakfast. 1 by mouth 1 time a day)   No facility-administered medications prior to visit.    Review of Systems  All other systems reviewed and are negative.   Family and social history as well as health maintenance and immunizations was reviewed.     Objective:      Physical Exam  Alert and in no distress. Tympanic membranes and canals are normal. Pharyngeal area is normal. Neck is supple without adenopathy or thyromegaly. Cardiac exam shows a regular sinus rhythm without murmurs or gallops. Lungs are clear to auscultation.  Diabetic foot exam is normal.  Hemoglobin A1c is 5.9      Assessment & Plan:    Discussed health benefits of physical activity, and encouraged him to engage in regular exercise appropriate for his age and condition.  Routine general medical examination at a health care facility - Plan: Cologuard, POCT UA - Microalbumin, HgB A1c  Persistent atrial fibrillation (HCC)  Impingement syndrome of left shoulder  Hypertension associated with diabetes (HCC) - Plan: amLODipine  (NORVASC ) 10 MG tablet, lisinopril -hydrochlorothiazide  (ZESTORETIC ) 20-12.5 MG tablet  Hyperlipidemia associated with type 2 diabetes mellitus (HCC) - Plan: atorvastatin  (LIPITOR) 20 MG tablet  Hypercoagulable state due to persistent atrial fibrillation (HCC)  Gastroesophageal reflux disease without esophagitis  Controlled type 2 diabetes mellitus with complication, without long-term current use of insulin (HCC)  Type 2 diabetes mellitus without complication, without long-term current use of insulin (HCC) - Plan: metFORMIN  (GLUCOPHAGE ) 1000 MG tablet  I congratulated him on the good work that he is doing with  making lifestyle changes including his weight loss.  He plans to continue to do this as he is very much enjoying the martial arts class. Return in about 6 months (around 03/31/2024).      Ron Cobbs, MD

## 2023-10-13 DIAGNOSIS — Z1211 Encounter for screening for malignant neoplasm of colon: Secondary | ICD-10-CM | POA: Diagnosis not present

## 2023-10-16 ENCOUNTER — Ambulatory Visit (HOSPITAL_COMMUNITY)
Admission: RE | Admit: 2023-10-16 | Discharge: 2023-10-16 | Disposition: A | Discharge status: Home / Self Care | Source: Ambulatory Visit | Attending: Internal Medicine | Admitting: Internal Medicine

## 2023-10-16 ENCOUNTER — Ambulatory Visit (HOSPITAL_COMMUNITY): Admitting: Internal Medicine

## 2023-10-16 VITALS — BP 130/72 | HR 63 | Ht 73.0 in | Wt 211.6 lb

## 2023-10-16 DIAGNOSIS — I4819 Other persistent atrial fibrillation: Secondary | ICD-10-CM | POA: Diagnosis not present

## 2023-10-16 DIAGNOSIS — D6869 Other thrombophilia: Secondary | ICD-10-CM | POA: Diagnosis not present

## 2023-10-16 DIAGNOSIS — I4891 Unspecified atrial fibrillation: Secondary | ICD-10-CM | POA: Diagnosis not present

## 2023-10-16 NOTE — Progress Notes (Signed)
 Primary Care Physician: Joyce Norleen BROCKS, MD Primary Cardiologist: Candyce Reek, MD Electrophysiologist: Eulas FORBES Furbish, MD     Referring Physician: Glendia Ferrier, PA-C     Marcus Holland is a 58 y.o. male with a history of HTN, T2DM, HLD, and persistent atrial fibrillation who presents for consultation in the [facility]. Seen by Glendia Ferrier, PA-C, on 05/30/23 and noted to be in Afib. Started patient on flecainide  75 mg BID. Patient is on Xarelto  20 mg daily for a CHADS2VASC score of 2.  On follow up 10/16/23, patient is currently in NSR. S/p Afib ablation on 09/18/23 by Dr. Furbish. No episodes of Afib since ablation. No chest pain or SOB. Leg sites healed without issue. No missed doses of anticoagulant.  Today, he denies symptoms of orthopnea, PND, lower extremity edema, dizziness, presyncope, syncope, snoring, daytime somnolence, bleeding, or neurologic sequela. The patient is tolerating medications without difficulties and is otherwise without complaint today.    Atrial Fibrillation Risk Factors:  He had a sleep study in early 2000s that noted OSA which did not require CPAP per report.  he has a BMI of Body mass index is 27.92 kg/m.SABRA Filed Weights   10/16/23 1120  Weight: 96 kg     Current Outpatient Medications  Medication Sig Dispense Refill   amLODipine  (NORVASC ) 10 MG tablet Take 1 tablet (10 mg total) by mouth daily. 90 tablet 3   Ascorbic Acid (VITAMIN C PO) Take 1 tablet by mouth daily.     atorvastatin  (LIPITOR) 20 MG tablet Take 1 tablet (20 mg total) by mouth daily. 90 tablet 3   cholecalciferol (VITAMIN D3) 25 MCG (1000 UNIT) tablet Take 1,000 Units by mouth daily.     FARXIGA  10 MG TABS tablet TAKE 1 TABLET(10 MG) BY MOUTH DAILY BEFORE BREAKFAST 90 tablet 1   KRILL OIL PO Take 1 capsule by mouth daily.     lisinopril -hydrochlorothiazide  (ZESTORETIC ) 20-12.5 MG tablet Take 1 tablet by mouth daily. 90 tablet 3   metFORMIN  (GLUCOPHAGE )  1000 MG tablet Take 1 tablet (1,000 mg total) by mouth daily with breakfast. 90 tablet 1   metoprolol  succinate (TOPROL -XL) 50 MG 24 hr tablet Take 1 tablet (50 mg total) by mouth daily. Take with or immediately following a meal. 90 tablet 3   MOUNJARO  2.5 MG/0.5ML Pen INJECT 12.5 MG UNDER THE SKIN ONE DAY A WEEK (Patient taking differently: Inject 2.5 mg into the skin once a week.) 2 mL 2   Multiple Vitamins-Minerals (MULTIVITAMIN WITH MINERALS) tablet Take 1 tablet by mouth daily.     Multiple Vitamins-Minerals (PRESERVISION AREDS 2+MULTI VIT PO) Take 1 capsule by mouth in the morning and at bedtime.     OVER THE COUNTER MEDICATION Apply 1 Application topically 3 (three) times daily. Clear Nail Organic Anti Fungal Treatment     potassium chloride  (KLOR-CON  M) 10 MEQ tablet Take 3 tablets (30 mEq total) by mouth daily. 270 tablet 3   terazosin  (HYTRIN ) 2 MG capsule TAKE 1 CAPSULE EVERY NIGHT AT BEDTIME 90 capsule 3   XARELTO  20 MG TABS tablet TAKE 1 TABLET(20 MG) BY MOUTH DAILY WITH SUPPER 90 tablet 1   zinc gluconate 50 MG tablet Take 50 mg by mouth daily.     No current facility-administered medications for this encounter.    Atrial Fibrillation Management history:  Previous antiarrhythmic drugs: flecainide  Previous cardioversions: 06/23/23 Previous ablations: 09/18/23 Anticoagulation history: Xarelto    ROS- All systems are reviewed and negative  except as per the HPI above.  Physical Exam: BP 130/72   Pulse 63   Ht 6' 1 (1.854 m)   Wt 96 kg   BMI 27.92 kg/m   GEN- The patient is well appearing, alert and oriented x 3 today.   Neck - no JVD or carotid bruit noted Lungs- Clear to ausculation bilaterally, normal work of breathing Heart- Regular rate and rhythm, no murmurs, rubs or gallops, PMI not laterally displaced Extremities- no clubbing, cyanosis, or edema Skin - no rash or ecchymosis noted   EKG today demonstrates  Vent. rate 63 BPM PR interval 200 ms QRS duration 88  ms QT/QTcB 414/423 ms P-R-T axes 43 12 -2 Normal sinus rhythm T wave abnormality, consider inferior ischemia Abnormal ECG When compared with ECG of 18-Sep-2023 09:15, PR interval slightly decreased  Echo 06/27/22 demonstrated   1. Left ventricular ejection fraction, by estimation, is 50 to 55%. Left  ventricular ejection fraction by 2D MOD biplane is 51.1 %. The left  ventricle has low normal function. The left ventricle has no regional wall  motion abnormalities. Left  ventricular diastolic function could not be evaluated.   2. Right ventricular systolic function is normal. The right ventricular  size is normal. Tricuspid regurgitation signal is inadequate for assessing  PA pressure.   3. The mitral valve is grossly normal. Trivial mitral valve  regurgitation.   4. The aortic valve is tricuspid. Aortic valve regurgitation is not  visualized.   5. Aortic dilatation noted. There is borderline dilatation of the  ascending aorta, measuring 39 mm.   CHA2DS2-VASc Score = 2  The patient's score is based upon: CHF History: 0 HTN History: 1 Diabetes History: 1 Stroke History: 0 Vascular Disease History: 0 Age Score: 0 Gender Score: 0       ASSESSMENT AND PLAN: Persistent Atrial Fibrillation (ICD10:  I48.19) The patient's CHA2DS2-VASc score is 2, indicating a 2.2% annual risk of stroke.   S/p DCCV on 06/23/23. S/p Afib ablation on 09/18/23 by Dr. Nancey.  He is currently in NSR. Continue Toprol  50 mg daily for now. Can consider reduction at next visit, per patient may prefer eventually to discontinue altogether in the future.    Secondary Hypercoagulable State (ICD10:  D68.69) The patient is at significant risk for stroke/thromboembolism based upon his CHA2DS2-VASc Score of 2.  Continue Rivaroxaban  (Xarelto ).  No missed doses of Xarelto .    Follow up with EP as scheduled   Terra Pac, PA-C  Afib Clinic [facility] [address] [place], [place]  [address] [phone]

## 2023-10-18 LAB — COLOGUARD: COLOGUARD: NEGATIVE

## 2023-10-19 ENCOUNTER — Ambulatory Visit: Payer: Self-pay | Admitting: Family Medicine

## 2023-11-08 ENCOUNTER — Other Ambulatory Visit: Payer: Self-pay | Admitting: Family Medicine

## 2023-11-08 DIAGNOSIS — I4891 Unspecified atrial fibrillation: Secondary | ICD-10-CM

## 2023-12-10 ENCOUNTER — Other Ambulatory Visit: Payer: Self-pay | Admitting: Family Medicine

## 2023-12-10 DIAGNOSIS — E119 Type 2 diabetes mellitus without complications: Secondary | ICD-10-CM

## 2023-12-19 ENCOUNTER — Ambulatory Visit: Admitting: Pulmonary Disease

## 2023-12-26 NOTE — Progress Notes (Signed)
  Electrophysiology Office Note:   Date:  12/29/2023  ID:  Richey, Doolittle [birth year], MRN [id number]  Primary Cardiologist: Candyce Reek, MD Electrophysiologist: Eulas FORBES Furbish, MD   Electrophysiologist:  Eulas FORBES Furbish, MD      History of Present Illness:   Marcus Holland is a 58 y.o. male with h/o HTN, T2DM, HLD, and persistent atrial fibrillation seen today for routine electrophysiology follow-up s/p Ablation.  Since last being seen in our clinic the patient reports doing very well. No further arrhythmia of which he is aware. Uses a fit bit that monitors his HR and has EKG capabilities. Otherwise, he denies chest pain, dyspnea, PND, orthopnea, nausea, vomiting, dizziness, syncope, edema, weight gain, or early satiety.    Review of systems complete and found to be negative unless listed in HPI.   EP Information / Studies Reviewed:    EKG is ordered today. Personal review as below.  EKG Interpretation Date/Time:  Monday December 29 2023 08:03:59 EDT Ventricular Rate:  54 PR Interval:  180 QRS Duration:  92 QT Interval:  450 QTC Calculation: 426 R Axis:   23  Text Interpretation: Sinus bradycardia When compared with ECG of 16-Oct-2023 11:30, No significant change was found Confirmed by Lesia Sharper [phone]) on 12/29/2023 8:09:37 AM    Arrhythmia/Device History S/p PVI and posterior wall ablation 09/18/2023   Physical Exam:   VS:  BP 130/82   Pulse (!) 54   Ht 6' 1 (1.854 m)   Wt 204 lb 6.4 oz (92.7 kg)   SpO2 98%   BMI 26.97 kg/m    Wt Readings from Last 3 Encounters:  12/29/23 204 lb 6.4 oz (92.7 kg)  10/16/23 211 lb 9.6 oz (96 kg)  09/30/23 212 lb 6.4 oz (96.3 kg)     GEN: No acute distress NECK: No JVD; No carotid bruits CARDIAC: Regular rate and rhythm, no murmurs, rubs, gallops RESPIRATORY:  Clear to auscultation without rales, wheezing or rhonchi  ABDOMEN: Soft, non-tender, non-distended EXTREMITIES:  No edema; No deformity   ASSESSMENT AND PLAN:     Persistent AF EKG today shows sinus bradycardia S/p PVI 09/18/2023 Continue Xarelto  20 mg daily for CHA2DS2/VASc of 2. Decrease toprol  to 25 mg daily.   He would like to eventually come off Xarelto  if possible. Discussed the safest way to do that would be with chronic monitoring with a loop recorder. Dawson are also an option, but he understands the data is less clear.   Will have Dr. Furbish see back in 6 months, and if still has had no further AF can discuss strategy.  He declined to have the visit set up as a possible loop; wants to speak to Dr. Furbish first.   HTN Stable on current regimen   DM2 Per PCP        Follow up with Dr. Furbish in 6 months  Signed, Sharper Prentice Lesia, PA-C

## 2023-12-29 ENCOUNTER — Encounter: Payer: Self-pay | Admitting: Student

## 2023-12-29 ENCOUNTER — Ambulatory Visit: Attending: Student | Admitting: Student

## 2023-12-29 ENCOUNTER — Other Ambulatory Visit: Payer: Self-pay | Admitting: Family Medicine

## 2023-12-29 VITALS — BP 130/82 | HR 54 | Ht 73.0 in | Wt 204.4 lb

## 2023-12-29 DIAGNOSIS — I4819 Other persistent atrial fibrillation: Secondary | ICD-10-CM

## 2023-12-29 DIAGNOSIS — I152 Hypertension secondary to endocrine disorders: Secondary | ICD-10-CM

## 2023-12-29 DIAGNOSIS — E1159 Type 2 diabetes mellitus with other circulatory complications: Secondary | ICD-10-CM | POA: Diagnosis not present

## 2023-12-29 DIAGNOSIS — E119 Type 2 diabetes mellitus without complications: Secondary | ICD-10-CM

## 2023-12-29 MED ORDER — METOPROLOL SUCCINATE ER 25 MG PO TB24: 25.0000 mg | ORAL_TABLET | Freq: Every day | ORAL | 3 refills | Status: AC

## 2023-12-29 NOTE — Patient Instructions (Addendum)
 Medication Instructions:  1.Decrease metoprolol  succinate (Toprol  XL) to 25 mg daily *If you need a refill on your cardiac medications before your next appointment, please call your pharmacy*  Lab Work: None ordered If you have labs (blood work) drawn today and your tests are completely normal, you will receive your results only by: MyChart Message (if you have MyChart) OR A paper copy in the mail If you have any lab test that is abnormal or we need to change your treatment, we will call you to review the results.  Follow-Up: At [facility], you and your health needs are our priority.  As part of our continuing mission to provide you with exceptional heart care, our providers are all part of one team.  This team includes your primary Cardiologist (physician) and Advanced Practice Providers or APPs (Physician Assistants and Nurse Practitioners) who all work together to provide you with the care you need, when you need it.  Your next appointment:   6 month(s)  Provider:   Eulas Furbish, MD only

## 2024-01-19 ENCOUNTER — Ambulatory Visit: Payer: Self-pay

## 2024-01-19 NOTE — Telephone Encounter (Signed)
 FYI Only or Action Required?: FYI only for provider.  Patient was last seen in primary care on 09/30/2023 by Joyce Norleen BROCKS, MD.  Called Nurse Triage reporting Pain.  Symptoms began a week ago.  Interventions attempted: Rest, hydration, or home remedies.  Symptoms are: gradually worsening.  Triage Disposition: See PCP When Office is Open (Within 3 Days)  Patient/caregiver understands and will follow disposition?: Yes Reason for Disposition  [1] After 3 days AND [2] pain not improved  Answer Assessment - Initial Assessment Questions Patient reports onset of pain after performing martial arts jumping up-snap kicks. States he did not strike an object, just air. Reports pain is present when flexing/extending the foot, is able to ambulate.   1. MECHANISM: How did the injury happen? (e.g., twisting injury, direct blow)      Atraumatic, was performing jumping up-snap kicks but did not strike object  2. ONSET: When did the injury happen? (Minutes or hours ago)      One week  3. LOCATION: Where is the injury located?      Top of foot  4. APPEARANCE of INJURY: What does the injury look like?      Denies abnormal appearance  5. WEIGHT-BEARING: Can you put weight on that foot? Can you walk (four steps or more)?       Able to bear weight, it painful with extension  6. SIZE: For cuts, bruises, or swelling, ask: How large is it? (e.g., inches or centimeters;  entire joint)      N/A  7. PAIN: Is there pain? If Yes, ask: How bad is the pain?    (e.g., Scale 1-10; or mild, moderate, severe)   - NONE (0): no pain.   - MILD (1-3): doesn't interfere with normal activities.    - MODERATE (4-7): interferes with normal activities (e.g., work or school) or awakens from sleep, limping.    - SEVERE (8-10): excruciating pain, unable to do any normal activities, unable to walk.      4/10 with extension  Protocols used: Ankle and Foot Injury-A-AH Copied from CRM #[id number]. Topic:  Clinical - Red Word Triage >> Jan 19, 2024  3:13 PM Rachelle R wrote: [facility] that prompted transfer to Nurse Triage: Pain in the top of his right foot above his big toe. Depending on how he moves his foot he gets a sharp pain.

## 2024-01-21 ENCOUNTER — Ambulatory Visit (INDEPENDENT_AMBULATORY_CARE_PROVIDER_SITE_OTHER): Admitting: Family Medicine

## 2024-01-21 ENCOUNTER — Encounter: Payer: Self-pay | Admitting: Family Medicine

## 2024-01-21 VITALS — BP 130/78 | HR 58 | Ht 73.0 in | Wt 202.6 lb

## 2024-01-21 DIAGNOSIS — S96911A Strain of unspecified muscle and tendon at ankle and foot level, right foot, initial encounter: Secondary | ICD-10-CM

## 2024-01-21 NOTE — Progress Notes (Addendum)
   Subjective:    Patient ID: Marcus Holland, male    DOB: 1965/09/07, 58 y.o.   MRN: [id number]  Discussed the use of AI scribe software for clinical note transcription with the patient, who gave verbal consent to proceed.  History of Present Illness   Marcus Holland is a 58 year old male who presents with foot pain after practicing a jumping snap kick.  He has been experiencing foot pain since last Thursday while practicing a jumping snap kick for martial arts. The pain is localized to a specific area on the foot and occurs when he raises his leg to a certain height and and plantar flexes. There was no direct injury or impact, but the pain was felt while in the air. He can walk and perform daily activities without pain, and can straighten his legs without issue. However, the pain is triggered when he extends his leg upward.He practices martial arts, specifically Taekwondo, which involves a lot of kicking. No history of injury to the foot and no pain in the ankle or great toe  He also experiences sharp pain in his ring finger, particularly when driving and resting his hand on the steering wheel. The pain is described as a sharp sensation that occurs when pressure is applied, such as when opening a bottle. It is localized to the ring finger and does not affect other fingers. He has noticed this issue when wearing his wedding ring.  SABRA He has had previous issues with his arm that resolved with stretching and exercises.           Review of Systems     Objective:    Physical Exam EXTREMITIES: Full motion of the ankle without tenderness.  No palpable tenderness over the tarsals or metatarsals or DIP joints.  The pain was slightly elicited when he plantar flexes the ankle with the knee in full extension.      Assessment & Plan:  Assessment and Plan    Strain of flexor tendons due to repetitive motion. No fracture or nerve involvement. Pain on foot extension, consistent with tendon strain. - Advise  rest and avoidance of pain-exacerbating activities. - Recommend Tylenol  for pain management. - Reassured condition is not serious and should improve with rest.  Nerve compression of right ring finger Intermittent sharp pain due to nerve compression. Symptoms suggest no ulnar nerve involvement. - Advise changing wrist position to alleviate compression. - Reassured condition is not dangerous and should improve with positional adjustments.

## 2024-02-04 ENCOUNTER — Ambulatory Visit (INDEPENDENT_AMBULATORY_CARE_PROVIDER_SITE_OTHER): Admitting: Family Medicine

## 2024-02-04 ENCOUNTER — Encounter: Payer: Self-pay | Admitting: Family Medicine

## 2024-02-04 VITALS — BP 138/78 | HR 58 | Ht 73.0 in | Wt 201.4 lb

## 2024-02-04 DIAGNOSIS — S90211A Contusion of right great toe with damage to nail, initial encounter: Secondary | ICD-10-CM | POA: Diagnosis not present

## 2024-02-04 DIAGNOSIS — S90211S Contusion of right great toe with damage to nail, sequela: Secondary | ICD-10-CM | POA: Diagnosis not present

## 2024-02-04 NOTE — Progress Notes (Signed)
   Subjective:    Patient ID: Marcus Holland, male    DOB: 01-16-1966, 58 y.o.   MRN: [id number]  Discussed the use of AI scribe software for clinical note transcription with the patient, who gave verbal consent to proceed.  History of Present Illness   Marcus Holland is a 58 year old male who presents with right great toe pain following a board breaking incident.  He experienced pain in the right great toe after a board breaking activity at a tournament. The incident occurred late in the day when he was tired and lost focus, leading to improper toe positioning during a kick. This resulted in an injury at the tip of the toe, which initially bled and exuded a clear liquid for about four to five days. Although the bleeding has stopped, the toe is less painful and swollen.  He is concerned about the toenail, which was bleeding and now appears discolored, with a possibility of losing the nail.  He attributes the injury to fatigue and a lack of focus during the tournament.  The pain has been improving over time.           Review of Systems     Objective:    Physical Exam Physical Exam   MUSCULOSKELETAL: No tenderness over right great toe joints or tuft SKIN: Right great toenail appears damaged.     Toenail slightly pale.       Assessment & Plan:  Contusion of right great toe  Soft tissue injury with risk of toenail dehiscence. No fracture suspected. Contusion improving but slow healing expected due to poor blood supply. - Protect toe and avoid trauma. - Apply protective pad to toe. - Trim toenail if loose to prevent catching. - Monitor for toenail dehiscence and allow natural growth.

## 2024-03-26 ENCOUNTER — Emergency Department (HOSPITAL_BASED_OUTPATIENT_CLINIC_OR_DEPARTMENT_OTHER)

## 2024-03-26 ENCOUNTER — Encounter (HOSPITAL_BASED_OUTPATIENT_CLINIC_OR_DEPARTMENT_OTHER): Payer: Self-pay

## 2024-03-26 ENCOUNTER — Emergency Department (HOSPITAL_BASED_OUTPATIENT_CLINIC_OR_DEPARTMENT_OTHER)
Admission: EM | Admit: 2024-03-26 | Discharge: 2024-03-26 | Disposition: A | Discharge status: Home / Self Care | Attending: Emergency Medicine | Admitting: Emergency Medicine

## 2024-03-26 ENCOUNTER — Other Ambulatory Visit: Payer: Self-pay

## 2024-03-26 DIAGNOSIS — I4891 Unspecified atrial fibrillation: Secondary | ICD-10-CM | POA: Insufficient documentation

## 2024-03-26 DIAGNOSIS — Z79899 Other long term (current) drug therapy: Secondary | ICD-10-CM | POA: Insufficient documentation

## 2024-03-26 DIAGNOSIS — I10 Essential (primary) hypertension: Secondary | ICD-10-CM | POA: Diagnosis not present

## 2024-03-26 DIAGNOSIS — Z7901 Long term (current) use of anticoagulants: Secondary | ICD-10-CM | POA: Diagnosis not present

## 2024-03-26 DIAGNOSIS — E119 Type 2 diabetes mellitus without complications: Secondary | ICD-10-CM | POA: Diagnosis not present

## 2024-03-26 DIAGNOSIS — S46911A Strain of unspecified muscle, fascia and tendon at shoulder and upper arm level, right arm, initial encounter: Secondary | ICD-10-CM | POA: Insufficient documentation

## 2024-03-26 DIAGNOSIS — M25511 Pain in right shoulder: Secondary | ICD-10-CM | POA: Diagnosis not present

## 2024-03-26 DIAGNOSIS — X58XXXA Exposure to other specified factors, initial encounter: Secondary | ICD-10-CM | POA: Insufficient documentation

## 2024-03-26 DIAGNOSIS — M19011 Primary osteoarthritis, right shoulder: Secondary | ICD-10-CM | POA: Diagnosis not present

## 2024-03-26 MED ORDER — PREDNISONE 50 MG PO TABS: 50.0000 mg | ORAL_TABLET | Freq: Every day | ORAL | 0 refills | Status: AC

## 2024-03-26 MED ORDER — PREDNISONE 50 MG PO TABS
60.0000 mg | ORAL_TABLET | Freq: Once | ORAL | Status: AC
  Administered 2024-03-26: 60 mg via ORAL
  Filled 2024-03-26: qty 1

## 2024-03-26 MED ORDER — KETOROLAC TROMETHAMINE 30 MG/ML IJ SOLN
30.0000 mg | Freq: Once | INTRAMUSCULAR | Status: AC
  Administered 2024-03-26: 30 mg via INTRAMUSCULAR
  Filled 2024-03-26: qty 1

## 2024-03-26 NOTE — ED Notes (Signed)
 Patient transported to X-ray

## 2024-03-26 NOTE — ED Provider Notes (Signed)
 [place] [facility] Provider Note   CSN: [id number] Arrival date & time: 03/26/24  9070     Patient presents with: Shoulder Pain   Marcus Holland is a 58 y.o. male.   Pt is a 58 yo male with pmhx significant for htn, hld, dm2, and afib (on Xarelto ).  Pt woke up with pain in his right shoulder on Wednesday, 12/3.  He denies any trauma.  He has pain over his right deltoid.  It hurts to lift his arm, but he can rotate his shoulder without problems.       Prior to Admission medications   Medication Sig Start Date End Date Taking? Authorizing Provider  predniSONE  (DELTASONE ) 50 MG tablet Take 1 tablet (50 mg total) by mouth daily with breakfast. 03/26/24  Yes Dean Clarity, MD  amLODipine  (NORVASC ) 10 MG tablet Take 1 tablet (10 mg total) by mouth daily. 09/30/23   Lalonde, John C, MD  Ascorbic Acid (VITAMIN C PO) Take 1 tablet by mouth daily.    [provider]  atorvastatin  (LIPITOR) 20 MG tablet Take 1 tablet (20 mg total) by mouth daily. 09/30/23   Lalonde, John C, MD  cholecalciferol (VITAMIN D3) 25 MCG (1000 UNIT) tablet Take 1,000 Units by mouth daily.    [provider]  FARXIGA  10 MG TABS tablet TAKE 1 TABLET(10 MG) BY MOUTH DAILY BEFORE BREAKFAST 12/29/23   Lalonde, John C, MD  KRILL OIL PO Take 1 capsule by mouth daily.    [provider]  lisinopril -hydrochlorothiazide  (ZESTORETIC ) 20-12.5 MG tablet Take 1 tablet by mouth daily. 09/30/23   Joyce Norleen BROCKS, MD  metFORMIN  (GLUCOPHAGE ) 1000 MG tablet Take 1 tablet (1,000 mg total) by mouth daily with breakfast. 09/30/23   Joyce Norleen BROCKS, MD  metoprolol  succinate (TOPROL  XL) 25 MG 24 hr tablet Take 1 tablet (25 mg total) by mouth daily. 12/29/23   Lesia Ozell Barter, PA-C  Multiple Vitamins-Minerals (MULTIVITAMIN WITH MINERALS) tablet Take 1 tablet by mouth daily.    [provider]  Multiple Vitamins-Minerals (PRESERVISION AREDS 2+MULTI VIT PO) Take 1 capsule by  mouth in the morning and at bedtime.    [provider]  OVER THE COUNTER MEDICATION Apply 1 Application topically 3 (three) times daily. Clear Nail Organic Anti Fungal Treatment    [provider]  potassium chloride  (KLOR-CON  M) 10 MEQ tablet Take 3 tablets (30 mEq total) by mouth daily. 09/02/23   Lelon Hamilton T, PA-C  terazosin  (HYTRIN ) 2 MG capsule TAKE 1 CAPSULE EVERY NIGHT AT BEDTIME 07/08/23   Joyce Norleen BROCKS, MD  tirzepatide  (MOUNJARO ) 2.5 MG/0.5ML Pen ADMINISTER 2.5 MG UNDER THE SKIN 1 TIME A WEEK 12/10/23   Joyce Norleen BROCKS, MD  XARELTO  20 MG TABS tablet TAKE 1 TABLET(20 MG) BY MOUTH DAILY WITH SUPPER 11/10/23   Lalonde, John C, MD  zinc gluconate 50 MG tablet Take 50 mg by mouth daily.    [provider]    Allergies: Patient has no known allergies.    Review of Systems  Musculoskeletal:        Right shoulder pain  All other systems reviewed and are negative.   Updated Vital Signs BP (!) 152/86 (BP Location: Right Arm)   Pulse 62   Temp 98 F (36.7 C) (Oral)   Resp 16   SpO2 100%   Physical Exam Vitals and nursing note reviewed.  Constitutional:      Appearance: Normal appearance.  HENT:  Head: Normocephalic and atraumatic.     Right Ear: External ear normal.     Left Ear: External ear normal.     Nose: Nose normal.     Mouth/Throat:     Mouth: Mucous membranes are moist.     Pharynx: Oropharynx is clear.  Eyes:     Extraocular Movements: Extraocular movements intact.     Conjunctiva/sclera: Conjunctivae normal.     Pupils: Pupils are equal, round, and reactive to light.  Cardiovascular:     Rate and Rhythm: Normal rate and regular rhythm.     Pulses: Normal pulses.     Heart sounds: Normal heart sounds.  Pulmonary:     Effort: Pulmonary effort is normal.     Breath sounds: Normal breath sounds.  Abdominal:     General: Abdomen is flat. Bowel sounds are normal.     Palpations: Abdomen is soft.  Musculoskeletal:       Arms:      Cervical back: Normal range of motion and neck supple.  Skin:    General: Skin is warm.     Capillary Refill: Capillary refill takes less than 2 seconds.  Neurological:     General: No focal deficit present.     Mental Status: He is alert and oriented to person, place, and time.  Psychiatric:        Mood and Affect: Mood normal.        Behavior: Behavior normal.     (all labs ordered are listed, but only abnormal results are displayed) Labs Reviewed - No data to display  EKG: EKG Interpretation Date/Time:  Friday March 26 2024 10:04:20 EST Ventricular Rate:  53 PR Interval:  188 QRS Duration:  101 QT Interval:  446 QTC Calculation: 419 R Axis:   37  Text Interpretation: Sinus rhythm No significant change since last tracing Confirmed by Dean Clarity [phone]) on 03/26/2024 10:05:53 AM  Radiology: [facility] Shoulder Right Result Date: 03/26/2024 EXAM: 1 VIEW(S) XRAY OF THE RIGHT SHOULDER 03/26/2024 10:21:00 AM COMPARISON: None available. CLINICAL HISTORY: pain FINDINGS: BONES AND JOINTS: Glenohumeral joint is normally aligned. No acute fracture. No malalignment. Minimal spurring at the acromioclavicular joint. Curved acromial undersurface. Minimal inferior spurring of the glenoid. Mild thoracic spondylosis. SOFT TISSUES: Atherosclerotic calcifications of thoracic aorta. Visualized lung is unremarkable. IMPRESSION: 1. No acute osseous abnormality of the shoulder. 2. Acromioclavicular joint osteoarthrosis with minimal spurring. 3. Minimal inferior glenoid spurring. 4. Atherosclerotic calcifications of the thoracic aorta. Electronically signed by: Ryan Salvage MD 03/26/2024 11:00 AM EST RP Workstation: [id number]     Procedures   Medications Ordered in the ED  predniSONE  (DELTASONE ) tablet 60 mg (has no administration in time range)  ketorolac  (TORADOL ) 30 MG/ML injection 30 mg (30 mg Intramuscular Given 03/26/24 0949)                                    Medical Decision  Making Amount and/or Complexity of Data Reviewed Radiology: ordered.  Risk Prescription drug management.   This patient presents to the ED for concern of right shoulder pain, this involves an extensive number of treatment options, and is a complaint that carries with it a high risk of complications and morbidity.  The differential diagnosis includes msk, radicular pain   Co morbidities that complicate the patient evaluation  htn, hld, dm2, and afib (on Xarelto )   Additional history obtained:  Additional history obtained from epic  chart review  Imaging Studies ordered:  I ordered imaging studies including r shoulder  I independently visualized and interpreted imaging which showed  1. No acute osseous abnormality of the shoulder.  2. Acromioclavicular joint osteoarthrosis with minimal spurring.  3. Minimal inferior glenoid spurring.  4. Atherosclerotic calcifications of the thoracic aorta.   I agree with the radiologist interpretation  Medicines ordered and prescription drug management:  I ordered medication including toradol  and prednisone  for sx  Reevaluation of the patient after these medicines showed that the patient improved I have reviewed the patients home medicines and have made adjustments as needed  Problem List / ED Course:  R shoulder strain:  possibly rotator cuff vs deltoid strain.  He is stable for d/c.  He will need to f/u with ortho.  Return if worse.    Reevaluation:  After the interventions noted above, I reevaluated the patient and found that they have :improved   Social Determinants of Health:  Lives at home   Dispostion:  After consideration of the diagnostic results and the patients response to treatment, I feel that the patent would benefit from discharge with outpatient f/u.       Final diagnoses:  Strain of right shoulder, initial encounter    ED Discharge Orders          Ordered    predniSONE  (DELTASONE ) 50 MG tablet  Daily  with breakfast        03/26/24 1107               Dean Clarity, MD 03/26/24 1109

## 2024-03-26 NOTE — ED Triage Notes (Signed)
 Pt states he is having right shoulder pain that started yesterday morning. No known injury/trauma to shoulder.   Discomfort when moving and when his shoulder is touched.

## 2024-03-31 ENCOUNTER — Ambulatory Visit (INDEPENDENT_AMBULATORY_CARE_PROVIDER_SITE_OTHER): Payer: Self-pay | Admitting: Family Medicine

## 2024-03-31 ENCOUNTER — Encounter: Payer: Self-pay | Admitting: Family Medicine

## 2024-03-31 VITALS — BP 138/82 | HR 54 | Ht 73.0 in | Wt 195.8 lb

## 2024-03-31 DIAGNOSIS — E118 Type 2 diabetes mellitus with unspecified complications: Secondary | ICD-10-CM

## 2024-03-31 DIAGNOSIS — E785 Hyperlipidemia, unspecified: Secondary | ICD-10-CM | POA: Diagnosis not present

## 2024-03-31 DIAGNOSIS — M25511 Pain in right shoulder: Secondary | ICD-10-CM

## 2024-03-31 DIAGNOSIS — K2101 Gastro-esophageal reflux disease with esophagitis, with bleeding: Secondary | ICD-10-CM

## 2024-03-31 DIAGNOSIS — E1169 Type 2 diabetes mellitus with other specified complication: Secondary | ICD-10-CM

## 2024-03-31 DIAGNOSIS — E669 Obesity, unspecified: Secondary | ICD-10-CM

## 2024-03-31 DIAGNOSIS — Z2821 Immunization not carried out because of patient refusal: Secondary | ICD-10-CM

## 2024-03-31 DIAGNOSIS — E1159 Type 2 diabetes mellitus with other circulatory complications: Secondary | ICD-10-CM | POA: Diagnosis not present

## 2024-03-31 DIAGNOSIS — I152 Hypertension secondary to endocrine disorders: Secondary | ICD-10-CM | POA: Diagnosis not present

## 2024-03-31 LAB — POCT GLYCOSYLATED HEMOGLOBIN (HGB A1C): Hemoglobin A1C: 5.9 % — AB (ref 4.0–5.6)

## 2024-03-31 NOTE — Progress Notes (Signed)
 Subjective:    Patient ID: Marcus Holland, male    DOB: November 17, 1965, 58 y.o.   MRN: [id number]  Marcus Holland is a 58 y.o. male who presents for follow-up of Type 2 diabetes mellitus.  Home blood sugar records: 95-120 Current symptoms/problems include none and have been unchanged. Daily foot checks: yes   Any foot concerns: no How often blood sugars checked: 1 x a day Exercise:  martial arts 4 x a week Diet: General Discussed the use of AI scribe software for clinical note transcription with the patient, who gave verbal consent to proceed.   He recently experienced acute right shoulder pain that began suddenly last Thursday morning, with pain intensifying to a level of ten when attempting to lift his arm. He visited the [facility] emergency room where he was diagnosed with a muscle issue, possibly involving the rotator cuff, and was prescribed a six-day course of steroids. X-rays revealed some arthritis, but the pain has since resolved and he reports no current issues.  He is currently taking Farxiga , metformin , and a low dose of Mounjaro  for diabetes management. His most recent A1c is 5.9. He is actively engaged in martial arts four days a week, which he attributes to improvements in his physical condition, including weight loss and increased shoulder mobility.  He has a history of atrial fibrillation for which he underwent a heart ablation procedure. He is on Xarelto  for heart health and metoprolol . He also takes terazosin . He continues to be involved in a martial arts program and he very much enjoys this.  This has helped with his weight loss.   No current shoulder pain or diabetes-related concerns.       The following portions of the patient's history were reviewed and updated as appropriate: allergies, current medications, past medical history, past social history and problem list.  ROS as in subjective above.     Objective:    Physical Exam Alert and in no distress.  Cardiac exam shows a  regular rhythm.  Hemoglobin A1c is 5.9. Lab Review    Latest Ref Rng & Units 09/30/2023    4:21 PM 09/29/2023    8:26 AM 08/19/2023    9:50 AM 06/16/2023    9:30 AM 06/03/2023    9:01 AM  Diabetic Labs  HbA1c 4.0 - 5.6 % 5.9     5.9   Microalbumin mg/L 7.3       Micro/Creat Ratio  5.0       Chol 100 - 199 mg/dL  877      HDL >60 mg/dL  34      Calc LDL 0 - 99 mg/dL  61      Triglycerides 0 - 149 mg/dL  841      Creatinine 9.23 - 1.27 mg/dL  8.99  9.11  9.00        03/26/2024    9:37 AM 02/04/2024    3:49 PM 01/21/2024   10:41 AM 12/29/2023    8:05 AM 10/16/2023   11:20 AM  BP/Weight  Systolic BP 152 138 130 130 130  Diastolic BP 86 78 78 82 72  Wt. (Lbs)  201.4 202.6 204.4 211.6  BMI  26.57 kg/m2 26.73 kg/m2 26.97 kg/m2 27.92 kg/m2      09/30/2023    2:00 PM 03/14/2021    8:30 AM  Foot/eye exam completion dates  Foot Form Completion Done Done    Marcus Holland  reports that he has never smoked. He has never used  smokeless tobacco. He reports that he does not drink alcohol and does not use drugs.     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 Effective management with current medications and weight under 200 pounds. - Continue Farxiga , metformin , and Mounjaro . - Encouraged continued physical activity, including martial arts four days a week.  Obesity Weight under 200 pounds indicates progress. Physical activity contributing to weight loss. - Encouraged continued physical activity and weight management.  Atrial fibrillation, status post ablation Status post ablation with no issues. Heart rate well-managed. Continues on  Xarelto . - Continue Xarelto . - Follow up with cardiology as needed.  General health maintenance Discussed immunizations. Shingles vaccine to be started at next physical. Pneumonia and tetanus vaccines up to date. - Will start shingles vaccine series at next physical. - Continue routine health maintenance.

## 2024-05-03 ENCOUNTER — Other Ambulatory Visit: Payer: Self-pay | Admitting: Family Medicine

## 2024-05-03 DIAGNOSIS — I4891 Unspecified atrial fibrillation: Secondary | ICD-10-CM

## 2024-05-24 ENCOUNTER — Other Ambulatory Visit: Payer: Self-pay | Admitting: Family Medicine

## 2024-05-24 DIAGNOSIS — E119 Type 2 diabetes mellitus without complications: Secondary | ICD-10-CM

## 2024-05-25 MED ORDER — MOUNJARO 2.5 MG/0.5ML ~~LOC~~ SOAJ: 2.5000 mg | SUBCUTANEOUS | 0 refills | Status: AC

## 2024-05-25 NOTE — Addendum Note (Signed)
 Addended by: JOYCE NORLEEN BROCKS on: 05/25/2024 10:11 AM   Modules accepted: Orders

## 2024-09-29 ENCOUNTER — Encounter: Payer: Self-pay | Admitting: Family Medicine
